# Patient Record
Sex: Male | Born: 1955 | Race: White | Hispanic: No | Marital: Single | Smoking: Former smoker
Health system: Southern US, Community
[De-identification: ages and names within clinical notes are randomized; demographics above are authoritative.]

## PROBLEM LIST (undated history)

## (undated) DIAGNOSIS — R011 Cardiac murmur, unspecified: Secondary | ICD-10-CM

## (undated) DIAGNOSIS — E785 Hyperlipidemia, unspecified: Secondary | ICD-10-CM

## (undated) DIAGNOSIS — M159 Polyosteoarthritis, unspecified: Secondary | ICD-10-CM

## (undated) DIAGNOSIS — K859 Acute pancreatitis without necrosis or infection, unspecified: Secondary | ICD-10-CM

## (undated) DIAGNOSIS — IMO0001 Reserved for inherently not codable concepts without codable children: Secondary | ICD-10-CM

## (undated) DIAGNOSIS — K5792 Diverticulitis of intestine, part unspecified, without perforation or abscess without bleeding: Secondary | ICD-10-CM

## (undated) DIAGNOSIS — A4902 Methicillin resistant Staphylococcus aureus infection, unspecified site: Secondary | ICD-10-CM

## (undated) DIAGNOSIS — Z5189 Encounter for other specified aftercare: Secondary | ICD-10-CM

## (undated) DIAGNOSIS — M150 Primary generalized (osteo)arthritis: Secondary | ICD-10-CM

## (undated) DIAGNOSIS — M1990 Unspecified osteoarthritis, unspecified site: Secondary | ICD-10-CM

## (undated) DIAGNOSIS — I10 Essential (primary) hypertension: Secondary | ICD-10-CM

## (undated) DIAGNOSIS — N400 Enlarged prostate without lower urinary tract symptoms: Secondary | ICD-10-CM

## (undated) DIAGNOSIS — B1920 Unspecified viral hepatitis C without hepatic coma: Secondary | ICD-10-CM

## (undated) HISTORY — PX: TONSILLECTOMY: SUR1361

## (undated) HISTORY — PX: APPENDECTOMY: SHX54

## (undated) HISTORY — PX: DENTAL SURGERY: SHX609

---

## 2004-08-30 ENCOUNTER — Emergency Department: Payer: Self-pay | Admitting: Emergency Medicine

## 2004-10-04 ENCOUNTER — Emergency Department: Payer: Self-pay | Admitting: Emergency Medicine

## 2004-11-18 ENCOUNTER — Emergency Department: Payer: Self-pay | Admitting: Unknown Physician Specialty

## 2006-05-27 ENCOUNTER — Emergency Department: Payer: Self-pay

## 2006-07-30 ENCOUNTER — Emergency Department: Payer: Self-pay | Admitting: Emergency Medicine

## 2006-11-21 ENCOUNTER — Emergency Department: Payer: Self-pay | Admitting: Emergency Medicine

## 2006-12-08 ENCOUNTER — Emergency Department: Payer: Self-pay | Admitting: Emergency Medicine

## 2007-07-09 ENCOUNTER — Emergency Department (HOSPITAL_COMMUNITY): Admission: EM | Admit: 2007-07-09 | Discharge: 2007-07-09 | Payer: Self-pay | Admitting: Emergency Medicine

## 2007-07-29 ENCOUNTER — Emergency Department (HOSPITAL_COMMUNITY): Admission: EM | Admit: 2007-07-29 | Discharge: 2007-07-29 | Payer: Self-pay | Admitting: Emergency Medicine

## 2007-08-16 ENCOUNTER — Emergency Department (HOSPITAL_COMMUNITY): Admission: EM | Admit: 2007-08-16 | Discharge: 2007-08-16 | Payer: Self-pay | Admitting: Emergency Medicine

## 2008-07-23 ENCOUNTER — Emergency Department (HOSPITAL_COMMUNITY): Admission: EM | Admit: 2008-07-23 | Discharge: 2008-07-23 | Payer: Self-pay | Admitting: Emergency Medicine

## 2008-08-12 ENCOUNTER — Emergency Department (HOSPITAL_COMMUNITY): Admission: EM | Admit: 2008-08-12 | Discharge: 2008-08-12 | Payer: Self-pay | Admitting: Emergency Medicine

## 2008-12-27 ENCOUNTER — Emergency Department (HOSPITAL_COMMUNITY): Admission: EM | Admit: 2008-12-27 | Discharge: 2008-12-28 | Payer: Self-pay | Admitting: Emergency Medicine

## 2009-05-05 ENCOUNTER — Emergency Department (HOSPITAL_COMMUNITY): Admission: EM | Admit: 2009-05-05 | Discharge: 2009-05-05 | Payer: Self-pay | Admitting: Emergency Medicine

## 2009-08-03 ENCOUNTER — Emergency Department (HOSPITAL_COMMUNITY): Admission: EM | Admit: 2009-08-03 | Discharge: 2009-08-03 | Payer: Self-pay | Admitting: Emergency Medicine

## 2010-05-04 ENCOUNTER — Emergency Department (HOSPITAL_COMMUNITY): Admission: EM | Admit: 2010-05-04 | Discharge: 2010-05-04 | Payer: Self-pay | Admitting: Emergency Medicine

## 2010-07-09 ENCOUNTER — Emergency Department (HOSPITAL_COMMUNITY): Admission: EM | Admit: 2010-07-09 | Discharge: 2010-07-09 | Payer: Self-pay | Admitting: Emergency Medicine

## 2010-10-07 ENCOUNTER — Emergency Department (HOSPITAL_COMMUNITY)
Admission: EM | Admit: 2010-10-07 | Discharge: 2010-10-07 | Disposition: A | Discharge status: Home / Self Care | Payer: Self-pay | Attending: *Deleted | Admitting: *Deleted

## 2010-10-07 DIAGNOSIS — R109 Unspecified abdominal pain: Secondary | ICD-10-CM | POA: Insufficient documentation

## 2010-10-07 DIAGNOSIS — E119 Type 2 diabetes mellitus without complications: Secondary | ICD-10-CM | POA: Insufficient documentation

## 2010-10-07 DIAGNOSIS — R112 Nausea with vomiting, unspecified: Secondary | ICD-10-CM | POA: Insufficient documentation

## 2010-10-08 ENCOUNTER — Emergency Department (HOSPITAL_COMMUNITY): Payer: Self-pay

## 2010-10-08 MED ORDER — IOHEXOL 300 MG/ML  SOLN
100.0000 mL | Freq: Once | INTRAMUSCULAR | Status: DC | PRN
  Administered 2010-10-08: 100 mL via INTRAVENOUS

## 2010-11-21 LAB — GLUCOSE, CAPILLARY: Glucose-Capillary: 122 mg/dL — ABNORMAL HIGH (ref 70–99)

## 2010-11-26 LAB — COMPREHENSIVE METABOLIC PANEL WITH GFR
ALT: 139 U/L — ABNORMAL HIGH (ref 0–53)
AST: 76 U/L — ABNORMAL HIGH (ref 0–37)
BUN: 4 mg/dL — ABNORMAL LOW (ref 6–23)
CO2: 29 meq/L (ref 19–32)
Chloride: 99 meq/L (ref 96–112)
GFR calc non Af Amer: >60 mL/min (ref >60)
Glucose, Bld: 184 mg/dL — ABNORMAL HIGH (ref 70–99)
Potassium: 4.4 meq/L (ref 3.5–5.1)
Total Bilirubin: 0.7 mg/dL (ref 0.3–1.2)

## 2010-11-26 LAB — LIPASE, BLOOD: Lipase: 26 U/L (ref 11–59)

## 2010-11-26 LAB — DIFFERENTIAL
Basophils Absolute: 0 K/uL (ref 0.0–0.1)
Basophils Relative: 0 % (ref 0–1)
Eosinophils Absolute: 0.2 K/uL (ref 0.0–0.7)
Eosinophils Relative: 3 % (ref 0–5)
Lymphocytes Relative: 33 % (ref 12–46)
Lymphs Abs: 1.8 K/uL (ref 0.7–4.0)
Monocytes Absolute: 0.5 10*3/uL (ref 0.1–1.0)
Monocytes Relative: 9 % (ref 3–12)
Neutro Abs: 3.1 10*3/uL (ref 1.7–7.7)
Neutrophils Relative %: 55 % (ref 43–77)

## 2010-11-26 LAB — COMPREHENSIVE METABOLIC PANEL
Albumin: 3.7 g/dL (ref 3.5–5.2)
Alkaline Phosphatase: 47 U/L (ref 39–117)
Calcium: 9.3 mg/dL (ref 8.4–10.5)
Creatinine, Ser: 0.8 mg/dL (ref 0.4–1.5)
GFR calc Af Amer: >60 mL/min (ref >60)
Sodium: 137 mEq/L (ref 135–145)
Total Protein: 7.2 g/dL (ref 6.0–8.3)

## 2010-11-26 LAB — CBC
HCT: 46.3 % (ref 39.0–52.0)
Hemoglobin: 15.4 g/dL (ref 13.0–17.0)
MCH: 29.7 pg (ref 26.0–34.0)
MCHC: 33.3 g/dL (ref 30.0–36.0)
MCV: 89.2 fL (ref 78.0–100.0)
Platelets: 174 10*3/uL (ref 150–400)
RBC: 5.19 MIL/uL (ref 4.22–5.81)
RDW: 12.4 % (ref 11.5–15.5)
WBC: 5.6 K/uL (ref 4.0–10.5)

## 2010-11-26 LAB — URINALYSIS, ROUTINE W REFLEX MICROSCOPIC
Bilirubin Urine: NEGATIVE
Glucose, UA: 100 mg/dL — AB
Hgb urine dipstick: NEGATIVE
Ketones, ur: NEGATIVE mg/dL
Nitrite: NEGATIVE
Protein, ur: NEGATIVE mg/dL
Specific Gravity, Urine: 1.005 (ref 1.005–1.030)
Urobilinogen, UA: 0.2 mg/dL (ref 0.0–1.0)
pH: 7 (ref 5.0–8.0)

## 2010-11-26 LAB — GLUCOSE, CAPILLARY

## 2010-12-10 LAB — DIFFERENTIAL
Basophils Absolute: 0.1 10*3/uL (ref 0.0–0.1)
Basophils Relative: 1 % (ref 0–1)
Eosinophils Absolute: 0.2 10*3/uL (ref 0.0–0.7)
Lymphocytes Relative: 36 % (ref 12–46)
Neutrophils Relative %: 49 % (ref 43–77)

## 2010-12-10 LAB — URINALYSIS, ROUTINE W REFLEX MICROSCOPIC
Bilirubin Urine: NEGATIVE
Glucose, UA: >1000 mg/dL — AB
Hgb urine dipstick: NEGATIVE
Leukocytes, UA: NEGATIVE
Protein, ur: NEGATIVE mg/dL
Specific Gravity, Urine: 1.039 — ABNORMAL HIGH (ref 1.005–1.030)
Urobilinogen, UA: 0.2 mg/dL (ref 0.0–1.0)
pH: 5 (ref 5.0–8.0)

## 2010-12-10 LAB — GLUCOSE, CAPILLARY: Glucose-Capillary: 233 mg/dL — ABNORMAL HIGH (ref 70–99)

## 2010-12-10 LAB — COMPREHENSIVE METABOLIC PANEL
AST: 80 U/L — ABNORMAL HIGH (ref 0–37)
Albumin: 4.4 g/dL (ref 3.5–5.2)
Alkaline Phosphatase: 52 U/L (ref 39–117)
CO2: 24 mEq/L (ref 19–32)
Calcium: 9.3 mg/dL (ref 8.4–10.5)
GFR calc Af Amer: >60 mL/min (ref >60)
GFR calc non Af Amer: >60 mL/min (ref >60)
Potassium: 3.2 mEq/L — ABNORMAL LOW (ref 3.5–5.1)
Total Bilirubin: 1.1 mg/dL (ref 0.3–1.2)
Total Protein: 8 g/dL (ref 6.0–8.3)

## 2010-12-13 LAB — URINALYSIS, ROUTINE W REFLEX MICROSCOPIC
Glucose, UA: 250 mg/dL — AB
Ketones, ur: NEGATIVE mg/dL
Nitrite: NEGATIVE
Protein, ur: NEGATIVE mg/dL
pH: 5 (ref 5.0–8.0)

## 2010-12-13 LAB — BASIC METABOLIC PANEL
CO2: 26 mEq/L (ref 19–32)
Calcium: 9.3 mg/dL (ref 8.4–10.5)
Sodium: 135 mEq/L (ref 135–145)

## 2010-12-13 LAB — CBC
MCHC: 35 g/dL (ref 30.0–36.0)
MCV: 90 fL (ref 78.0–100.0)
Platelets: 202 10*3/uL (ref 150–400)
WBC: 6.4 10*3/uL (ref 4.0–10.5)

## 2010-12-13 LAB — DIFFERENTIAL
Basophils Absolute: 0.1 10*3/uL (ref 0.0–0.1)
Basophils Relative: 1 % (ref 0–1)
Eosinophils Absolute: 0.2 10*3/uL (ref 0.0–0.7)
Neutrophils Relative %: 47 % (ref 43–77)

## 2010-12-17 LAB — URINALYSIS, ROUTINE W REFLEX MICROSCOPIC
Leukocytes, UA: NEGATIVE
Protein, ur: NEGATIVE mg/dL
Urobilinogen, UA: 1 mg/dL (ref 0.0–1.0)

## 2010-12-17 LAB — DIFFERENTIAL
Basophils Relative: 1 % (ref 0–1)
Lymphs Abs: 1.7 10*3/uL (ref 0.7–4.0)
Monocytes Absolute: 0.6 10*3/uL (ref 0.1–1.0)
Monocytes Relative: 10 % (ref 3–12)
Neutro Abs: 4 10*3/uL (ref 1.7–7.7)

## 2010-12-17 LAB — COMPREHENSIVE METABOLIC PANEL
Albumin: 3.5 g/dL (ref 3.5–5.2)
Alkaline Phosphatase: 57 U/L (ref 39–117)
BUN: 7 mg/dL (ref 6–23)
Potassium: 3.7 mEq/L (ref 3.5–5.1)
Sodium: 133 mEq/L — ABNORMAL LOW (ref 135–145)
Total Protein: 6.7 g/dL (ref 6.0–8.3)

## 2010-12-17 LAB — CBC
HCT: 44.5 % (ref 39.0–52.0)
Platelets: 158 10*3/uL (ref 150–400)
RDW: 12.4 % (ref 11.5–15.5)

## 2010-12-17 LAB — URINE MICROSCOPIC-ADD ON

## 2011-01-04 ENCOUNTER — Emergency Department: Payer: Self-pay | Admitting: Emergency Medicine

## 2011-08-30 ENCOUNTER — Emergency Department (HOSPITAL_COMMUNITY): Payer: Self-pay

## 2011-08-30 ENCOUNTER — Emergency Department (HOSPITAL_COMMUNITY)
Admission: EM | Admit: 2011-08-30 | Discharge: 2011-08-30 | Disposition: A | Discharge status: Home / Self Care | Payer: Self-pay | Attending: Emergency Medicine | Admitting: Emergency Medicine

## 2011-08-30 ENCOUNTER — Encounter: Payer: Self-pay | Admitting: Emergency Medicine

## 2011-08-30 DIAGNOSIS — S139XXA Sprain of joints and ligaments of unspecified parts of neck, initial encounter: Secondary | ICD-10-CM | POA: Insufficient documentation

## 2011-08-30 DIAGNOSIS — S161XXA Strain of muscle, fascia and tendon at neck level, initial encounter: Secondary | ICD-10-CM

## 2011-08-30 DIAGNOSIS — W010XXA Fall on same level from slipping, tripping and stumbling without subsequent striking against object, initial encounter: Secondary | ICD-10-CM | POA: Insufficient documentation

## 2011-08-30 DIAGNOSIS — M549 Dorsalgia, unspecified: Secondary | ICD-10-CM | POA: Insufficient documentation

## 2011-08-30 HISTORY — DX: Unspecified osteoarthritis, unspecified site: M19.90

## 2011-08-30 MED ORDER — OXYCODONE-ACETAMINOPHEN 5-325 MG PO TABS
2.0000 | ORAL_TABLET | Freq: Once | ORAL | Status: AC
  Administered 2011-08-30: 2 via ORAL
  Filled 2011-08-30: qty 2

## 2011-08-30 MED ORDER — HYDROCODONE-ACETAMINOPHEN 5-500 MG PO TABS: 1.0000 | ORAL_TABLET | Freq: Four times a day (QID) | ORAL | Status: AC | PRN

## 2011-08-30 MED ORDER — CYCLOBENZAPRINE HCL 10 MG PO TABS: 5.0000 mg | ORAL_TABLET | Freq: Two times a day (BID) | ORAL | Status: AC | PRN

## 2011-08-30 NOTE — ED Notes (Signed)
Pt states he slipped in yogurt in grocery store approx 1 hour ago.  C/o pain across shoulder blades and neck.  Denies LOC.

## 2011-08-30 NOTE — ED Notes (Signed)
Pt slipped in yogurt and fell in grocery store approx 1 hour pta.  C/o pain to neck and across shoulder blades.  Denies LOC.  Ambulatory.C-collar applied on arrival at triage.

## 2011-08-30 NOTE — ED Provider Notes (Signed)
History     CSN: [id number]  Arrival date & time 08/30/11  4098   First MD Initiated Contact with Patient 08/30/11 1914      Chief Complaint  Patient presents with  . Fall    (Consider location/radiation/quality/duration/timing/severity/associated sxs/prior treatment) HPI Comments: Patient was walking in the grocery store slipped on an open container of yogurt that was accidentally dropped on the floor landing on his right shoulder, upper back area, now complaining of pain across the shoulders and lower neck, increases with turning his head, right or left has not taking any medication.  Prior to arrival as this happened less than 45 minutes ago  Patient is a 55 y.o. male presenting with fall. The history is provided by the patient.  Fall The fall occurred while walking. He fell from a height of 3 to 5 ft. He landed on a hard floor. The point of impact was the right shoulder. Pertinent negatives include no numbness and no headaches.    Past Medical History  Diagnosis Date  . Arthritis   . Diabetes mellitus     History reviewed. No pertinent past surgical history.  History reviewed. No pertinent family history.  History  Substance Use Topics  . Smoking status: Never Smoker   . Smokeless tobacco: Not on file  . Alcohol Use: No      Review of Systems  Constitutional: Negative.   HENT: Negative.   Eyes: Negative.   Respiratory: Negative.   Cardiovascular: Negative.   Gastrointestinal: Negative.   Genitourinary: Negative.   Musculoskeletal: Positive for back pain.  Skin: Negative.   Neurological: Negative for dizziness, numbness and headaches.  Hematological: Negative.   Psychiatric/Behavioral: Negative.     Allergies  Review of patient's allergies indicates no known allergies.  Home Medications   Current Outpatient Rx  Name Route Sig Dispense Refill  . METFORMIN HCL 500 MG PO TABS Oral Take 500 mg by mouth 2 (two) times daily with a meal.        BP  156/107  Pulse 93  Resp 16  SpO2 100%  Physical Exam  Constitutional: He appears well-developed and well-nourished.  HENT:  Head: Normocephalic.  Eyes: Pupils are equal, round, and reactive to light.  Neck: Spinous process tenderness and muscular tenderness present. No rigidity. Decreased range of motion present. No edema and no erythema present.      ED Course  Procedures (including critical care time)  Labs Reviewed - No data to display Dg Cervical Spine Complete  08/30/2011  *RADIOLOGY REPORT*  Clinical Data:  Pain, fall, struck shoulder, posterior neck pain  CERVICAL SPINE - COMPLETE 4+ VIEW  Comparison: None  Findings: Examination performed upright in-collar. The presence of a collar on upright images of the cervical spine may prevent identification of ligamentous and unstable injuries.  Osseous demineralization. Disc space narrowing with endplate spur formation C4-C5, C5-C6, C6- C7. Prevertebral soft tissues normal thickness. Scattered facet degenerative changes. Vertebral body heights maintained without fracture or subluxation. Multilevel neural foraminal narrowing, greatest at C5-C6 bilaterally. C1-C2 alignment normal.  IMPRESSION: Degenerative disc and facet disease changes of the cervical spine. No acute abnormalities identified on upright in-collar cervical spine series as above.  Original Report Authenticated By: Lollie Marrow, M.D.     No diagnosis found.    MDM  Will x-ray to rule out subtle fracture although I doubt this after exam.  This is most likely muscular injury.  Will supply pain medication while in the emergency room.  Arman Filter, NP 08/30/11 2008  Arman Filter, NP 08/30/11 2126

## 2011-08-30 NOTE — ED Provider Notes (Signed)
Medical screening examination/treatment/procedure(s) were performed by non-physician practitioner and as supervising physician I was immediately available for consultation/collaboration.   Lyanne Co, MD 08/30/11 [phone]

## 2011-09-04 ENCOUNTER — Emergency Department (HOSPITAL_COMMUNITY)
Admission: EM | Admit: 2011-09-04 | Discharge: 2011-09-04 | Disposition: A | Discharge status: Home / Self Care | Payer: Self-pay | Attending: Emergency Medicine | Admitting: Emergency Medicine

## 2011-09-04 ENCOUNTER — Encounter (HOSPITAL_COMMUNITY): Payer: Self-pay | Admitting: *Deleted

## 2011-09-04 DIAGNOSIS — Z79899 Other long term (current) drug therapy: Secondary | ICD-10-CM | POA: Insufficient documentation

## 2011-09-04 DIAGNOSIS — S139XXA Sprain of joints and ligaments of unspecified parts of neck, initial encounter: Secondary | ICD-10-CM | POA: Insufficient documentation

## 2011-09-04 DIAGNOSIS — S161XXA Strain of muscle, fascia and tendon at neck level, initial encounter: Secondary | ICD-10-CM

## 2011-09-04 DIAGNOSIS — M542 Cervicalgia: Secondary | ICD-10-CM | POA: Insufficient documentation

## 2011-09-04 DIAGNOSIS — M549 Dorsalgia, unspecified: Secondary | ICD-10-CM | POA: Insufficient documentation

## 2011-09-04 DIAGNOSIS — M5412 Radiculopathy, cervical region: Secondary | ICD-10-CM | POA: Insufficient documentation

## 2011-09-04 DIAGNOSIS — W19XXXA Unspecified fall, initial encounter: Secondary | ICD-10-CM | POA: Insufficient documentation

## 2011-09-04 DIAGNOSIS — E119 Type 2 diabetes mellitus without complications: Secondary | ICD-10-CM | POA: Insufficient documentation

## 2011-09-04 DIAGNOSIS — M129 Arthropathy, unspecified: Secondary | ICD-10-CM | POA: Insufficient documentation

## 2011-09-04 MED ORDER — PREDNISONE 20 MG PO TABS
ORAL_TABLET | ORAL | Status: AC
  Filled 2011-09-04: qty 1

## 2011-09-04 MED ORDER — PREDNISONE 20 MG PO TABS
60.0000 mg | ORAL_TABLET | Freq: Once | ORAL | Status: AC
  Administered 2011-09-04: 60 mg via ORAL
  Filled 2011-09-04: qty 1

## 2011-09-04 MED ORDER — DIAZEPAM 5 MG PO TABS: 5.0000 mg | ORAL_TABLET | Freq: Three times a day (TID) | ORAL | Status: AC | PRN

## 2011-09-04 MED ORDER — ONDANSETRON 4 MG PO TBDP
8.0000 mg | ORAL_TABLET | Freq: Once | ORAL | Status: AC
  Administered 2011-09-04: 8 mg via ORAL
  Filled 2011-09-04: qty 1

## 2011-09-04 MED ORDER — HYDROMORPHONE HCL PF 2 MG/ML IJ SOLN
2.0000 mg | Freq: Once | INTRAMUSCULAR | Status: AC
  Administered 2011-09-04: 2 mg via INTRAMUSCULAR
  Filled 2011-09-04: qty 1

## 2011-09-04 MED ORDER — PREDNISONE 20 MG PO TABS: 40.0000 mg | ORAL_TABLET | Freq: Every day | ORAL | Status: AC

## 2011-09-04 MED ORDER — DIAZEPAM 5 MG PO TABS
5.0000 mg | ORAL_TABLET | Freq: Once | ORAL | Status: AC
  Administered 2011-09-04: 5 mg via ORAL
  Filled 2011-09-04: qty 1

## 2011-09-04 NOTE — ED Provider Notes (Signed)
History     CSN: [id number]  Arrival date & time 09/04/11  1656   First MD Initiated Contact with Patient 09/04/11 2254      Chief Complaint  Patient presents with  . Fall     Patient is a 55 y.o. male presenting with neck injury. The history is provided by the patient.  Neck Injury This is a new problem. The current episode started in the past 7 days. The problem occurs constantly. The problem has been gradually worsening. The symptoms are aggravated by twisting. He has tried NSAIDs and oral narcotics for the symptoms.  Neck Injury This is a new problem. The current episode started in the past 7 days. The problem occurs constantly. The problem has been gradually worsening. The symptoms are aggravated by twisting. He has tried NSAIDs and oral narcotics for the symptoms.  Patient reports worsening neck pain since fall 08/30/2011. States was seen here on the day of the fall and told no broken bones. Now pain radiates from the mid neck into bilateral upper back. It pain is constant ache with occasional sharp type pains in bilateral upper back. Currently reports pain as a 9/10. States ibuprofen not helping.  Past Medical History  Diagnosis Date  . Arthritis   . Diabetes mellitus     History reviewed. No pertinent past surgical history.  History reviewed. No pertinent family history.  History  Substance Use Topics  . Smoking status: Never Smoker   . Smokeless tobacco: Not on file  . Alcohol Use: No      Review of Systems  Constitutional: Negative.   HENT: Negative.   Eyes: Negative.   Respiratory: Negative.   Cardiovascular: Negative.   Gastrointestinal: Negative.   Genitourinary: Negative.   Musculoskeletal: Negative.   Skin: Negative.   Neurological: Negative.   Hematological: Negative.   Psychiatric/Behavioral: Negative.     Allergies  Review of patient's allergies indicates no known allergies.  Home Medications   Current Outpatient Rx  Name Route Sig  Dispense Refill  . CYCLOBENZAPRINE HCL 10 MG PO TABS Oral Take 0.5 tablets (5 mg total) by mouth 2 (two) times daily as needed for muscle spasms. 20 tablet 0  . HYDROCODONE-ACETAMINOPHEN 5-500 MG PO TABS Oral Take 1-2 tablets by mouth every 6 (six) hours as needed for pain. 15 tablet 0  . METFORMIN HCL 500 MG PO TABS Oral Take 500 mg by mouth 2 (two) times daily with a meal.        BP 149/94  Pulse 99  Temp(Src) 97.5 F (36.4 C) (Oral)  Resp 16  SpO2 97%  Physical Exam  Constitutional: He appears well-developed and well-nourished.  HENT:  Head: Normocephalic and atraumatic.  Eyes: Conjunctivae are normal.  Neck: Neck supple.  Cardiovascular: Normal rate and regular rhythm.   Pulmonary/Chest: Effort normal and breath sounds normal.  Abdominal: Soft. Bowel sounds are normal.  Musculoskeletal: Normal range of motion.       Mild TTP over lower C-spine patient reports pain radiates into bilateral upper back  Neurological: He is alert.  Skin: Skin is warm and dry.  Psychiatric: He has a normal mood and affect.    ED Course  Procedures Discussed with patient that  X-rays of his neck on 08/30/2011  are without acute findings. If pain continues and does not improve it will be necessary for him to arrange followup with an orthopedic physician. Will treat patient's pain here today and plan for discharge home on prednisone with muscle relaxers  to add to his ibuprofen. She is agreeable with plan to   1. Cervical strain   2. Cervical radiculopathy      Cervical strain    Medical screening examination/treatment/procedure(s) were performed by non-physician practitioner and as supervising physician I was immediately available for consultation/collaboration. Osvaldo Human, M.D.     Leanne Chang, NP 09/04/11 2331  Roma Kayser Schorr, NP 09/04/11 4098  Carleene Cooper III, MD 09/05/11 1140

## 2011-09-04 NOTE — ED Notes (Addendum)
Complaining of headache. States gets minor relief with ibuprofen. Located frontal and described as throbbing. Also c/o nausea

## 2011-09-04 NOTE — ED Notes (Signed)
The pt fell 7 days ago and he struck his head .  He was seen here in the ed shortly afterward and was given meds.  His neck stiffness and head pain are persisting.

## 2011-11-11 ENCOUNTER — Emergency Department (HOSPITAL_COMMUNITY)
Admission: EM | Admit: 2011-11-11 | Discharge: 2011-11-11 | Disposition: A | Discharge status: Home / Self Care | Payer: Self-pay | Attending: Emergency Medicine | Admitting: Emergency Medicine

## 2011-11-11 ENCOUNTER — Encounter (HOSPITAL_COMMUNITY): Payer: Self-pay | Admitting: Emergency Medicine

## 2011-11-11 DIAGNOSIS — L600 Ingrowing nail: Secondary | ICD-10-CM | POA: Insufficient documentation

## 2011-11-11 DIAGNOSIS — M129 Arthropathy, unspecified: Secondary | ICD-10-CM | POA: Insufficient documentation

## 2011-11-11 DIAGNOSIS — E119 Type 2 diabetes mellitus without complications: Secondary | ICD-10-CM | POA: Insufficient documentation

## 2011-11-11 DIAGNOSIS — Z8619 Personal history of other infectious and parasitic diseases: Secondary | ICD-10-CM | POA: Insufficient documentation

## 2011-11-11 DIAGNOSIS — B1920 Unspecified viral hepatitis C without hepatic coma: Secondary | ICD-10-CM | POA: Insufficient documentation

## 2011-11-11 DIAGNOSIS — A4902 Methicillin resistant Staphylococcus aureus infection, unspecified site: Secondary | ICD-10-CM | POA: Insufficient documentation

## 2011-11-11 DIAGNOSIS — Z8614 Personal history of Methicillin resistant Staphylococcus aureus infection: Secondary | ICD-10-CM | POA: Insufficient documentation

## 2011-11-11 HISTORY — DX: Unspecified viral hepatitis C without hepatic coma: B19.20

## 2011-11-11 HISTORY — DX: Methicillin resistant Staphylococcus aureus infection, unspecified site: A49.02

## 2011-11-11 HISTORY — DX: Encounter for other specified aftercare: Z51.89

## 2011-11-11 HISTORY — DX: Reserved for inherently not codable concepts without codable children: IMO0001

## 2011-11-11 MED ORDER — OXYCODONE-ACETAMINOPHEN 5-325 MG PO TABS
2.0000 | ORAL_TABLET | Freq: Once | ORAL | Status: AC
  Administered 2011-11-11: 2 via ORAL
  Filled 2011-11-11: qty 2

## 2011-11-11 MED ORDER — CLINDAMYCIN HCL 150 MG PO CAPS: 300.0000 mg | ORAL_CAPSULE | Freq: Three times a day (TID) | ORAL | Status: DC

## 2011-11-11 MED ORDER — OXYCODONE-ACETAMINOPHEN 5-325 MG PO TABS: 1.0000 | ORAL_TABLET | ORAL | Status: DC | PRN

## 2011-11-11 MED ORDER — CLINDAMYCIN HCL 300 MG PO CAPS
300.0000 mg | ORAL_CAPSULE | Freq: Once | ORAL | Status: AC
  Administered 2011-11-11: 300 mg via ORAL
  Filled 2011-11-11: qty 1

## 2011-11-11 NOTE — Discharge Instructions (Signed)
Infected Ingrown Toenail  An infected ingrown toenail occurs when the nail edge grows into the skin and bacteria invade the area. Symptoms include pain, tenderness, swelling, and pus drainage from the edge of the nail. Poorly fitting shoes, minor injuries, and improper cutting of the toenail may also contribute to the problem. You should cut your toenails squarely instead of rounding the edges. Do not cut them too short. Avoid tight or pointed toe shoes. Sometimes the ingrown portion of the nail must be removed. If your toenail is removed, it can take 3-4 months for it to re-grow.  HOME CARE INSTRUCTIONS     Soak your infected toe in warm water for 20-30 minutes, 2 to 3 times a day.    Packing or dressings applied to the area should be changed daily.    Take medicine as directed and finish them.    Reduce activities and keep your foot elevated when able to reduce swelling and discomfort. Do this until the infection gets better.    Wear sandals or go barefoot as much as possible while the infected area is sensitive.    See your caregiver for follow-up care in 2-3 days if the infection is not better.   SEEK MEDICAL CARE IF:    Your toe is becoming more red, swollen or painful.  MAKE SURE YOU:     Understand these instructions.    Will watch your condition.    Will get help right away if you are not doing well or get worse.   Document Released: 10/01/2004 Document Revised: 08/13/2011 Document Reviewed: 08/20/2008  [employer] Patient Information 2012 [employer]

## 2011-11-11 NOTE — ED Notes (Signed)
D/C inst given by MD. Pt verbalized understanding. Rx given to pt.

## 2011-11-11 NOTE — ED Notes (Signed)
Patient complaining of a left toe infection; complaining of pain for the last week.  Patient states that he was diagnosed with MRSA a year ago.  Patient has been treating toe with peroxide; patient reports pus-like drainage from toe.

## 2011-11-11 NOTE — ED Provider Notes (Signed)
History     CSN: [id number]  Arrival date & time 11/11/11  1821   First MD Initiated Contact with Patient 11/11/11 1846      Chief Complaint  Patient presents with  . Toe Pain     Patient is a 56 y.o. male presenting with toe pain. The history is provided by the patient.  Toe Pain This is a new problem. The current episode started more than 2 days ago. The problem occurs constantly. The problem has been gradually worsening. Pertinent negatives include no headaches. The symptoms are aggravated by walking. The symptoms are relieved by rest. He has tried water for the symptoms. The treatment provided no relief.  pt reports pain/swelling/drainage from left great toe No trauma Reports he thinks it is MRSA It has been draining pus No fever/vomiting/chills He reports he is monitoring his glucose and he keeps it below 200  Past Medical History  Diagnosis Date  . Arthritis   . Diabetes mellitus   . MRSA (methicillin resistant Staphylococcus aureus)   . Hepatitis C   . Blood transfusion     History reviewed. No pertinent past surgical history.  History reviewed. No pertinent family history.  History  Substance Use Topics  . Smoking status: Never Smoker   . Smokeless tobacco: Not on file  . Alcohol Use: No      Review of Systems  Constitutional: Negative for fever.  Neurological: Negative for headaches.    Allergies  Review of patient's allergies indicates no known allergies.  Home Medications   Current Outpatient Rx  Name Route Sig Dispense Refill  . GLIPIZIDE PO Oral Take 2 tablets by mouth 2 (two) times daily.    Marland Kitchen METFORMIN HCL 500 MG PO TABS Oral Take 500 mg by mouth 2 (two) times daily with a meal.      . CLINDAMYCIN HCL 150 MG PO CAPS Oral Take 2 capsules (300 mg total) by mouth 3 (three) times daily. 42 capsule 0  . OXYCODONE-ACETAMINOPHEN 5-325 MG PO TABS Oral Take 1 tablet by mouth every 4 (four) hours as needed for pain. 15 tablet 0    BP 146/101  Pulse  100  Temp(Src) 98.3 F (36.8 C) (Oral)  Resp 16  SpO2 100%  Physical Exam CONSTITUTIONAL: Well developed/well nourished HEAD AND FACE: Normocephalic/atraumatic EYES: EOMI/PERRL ENMT: Mucous membranes moist NECK: supple no meningeal signs CV: S1/S2 noted, no murmurs/rubs/gallops noted LUNGS: Lungs are clear to auscultation bilaterally, no apparent distress ABDOMEN: soft, nontender, no rebound or guarding NEURO: Pt is awake/alert, moves all extremitiesx4 EXTREMITIES: pulses normal, full ROM.  Left great toe.  Area of erythema surrounding nail, with small amt of drainage on medial aspect.   No crepitance. No streaking proximally.  He can range the toe.  No other tenderness noted to foot.   SKIN: warm, color normal PSYCH: no abnormalities of mood noted  ED Course  Procedures     1. Ingrown toenail       MDM  Nursing notes reviewed and considered in documentation   Pt well appearing, no distress Likely ingrown toenail, but it appears isolated/superficial and I doubt osteomyelitis or deep space infection After discussion, patient and I agreed for him to start clindamycin, continue warm soaks, will defer nail removal at this time.  He will return here in 48 hrs if there is no improvement.  Discussed need for f/u with podiatrist  The patient appears reasonably screened and/or stabilized for discharge and I doubt any other medical condition or  other EMC requiring further screening, evaluation, or treatment in the ED at this time prior to discharge.         Joya Gaskins, MD 11/11/11 2002

## 2011-11-21 ENCOUNTER — Encounter (HOSPITAL_COMMUNITY): Payer: Self-pay

## 2011-11-21 ENCOUNTER — Emergency Department (HOSPITAL_COMMUNITY)
Admission: EM | Admit: 2011-11-21 | Discharge: 2011-11-21 | Disposition: A | Discharge status: Home / Self Care | Payer: Self-pay | Attending: Emergency Medicine | Admitting: Emergency Medicine

## 2011-11-21 DIAGNOSIS — M129 Arthropathy, unspecified: Secondary | ICD-10-CM | POA: Insufficient documentation

## 2011-11-21 DIAGNOSIS — E119 Type 2 diabetes mellitus without complications: Secondary | ICD-10-CM | POA: Insufficient documentation

## 2011-11-21 DIAGNOSIS — B1920 Unspecified viral hepatitis C without hepatic coma: Secondary | ICD-10-CM | POA: Insufficient documentation

## 2011-11-21 DIAGNOSIS — Z8614 Personal history of Methicillin resistant Staphylococcus aureus infection: Secondary | ICD-10-CM | POA: Insufficient documentation

## 2011-11-21 DIAGNOSIS — L03032 Cellulitis of left toe: Secondary | ICD-10-CM

## 2011-11-21 DIAGNOSIS — L03039 Cellulitis of unspecified toe: Secondary | ICD-10-CM | POA: Insufficient documentation

## 2011-11-21 MED ORDER — OXYCODONE-ACETAMINOPHEN 5-325 MG PO TABS: 1.0000 | ORAL_TABLET | Freq: Four times a day (QID) | ORAL | Status: DC | PRN

## 2011-11-21 MED ORDER — CLINDAMYCIN HCL 150 MG PO CAPS: 150.0000 mg | ORAL_CAPSULE | Freq: Four times a day (QID) | ORAL | Status: DC

## 2011-11-21 NOTE — Discharge Instructions (Signed)
Paronychia  Paronychia is an inflammatory reaction involving the folds of the skin surrounding the fingernail. This is commonly caused by an infection in the skin around a nail. The most common cause of paronychia is frequent wetting of the hands (as seen with bartenders, food servers, nurses or others who wet their hands). This makes the skin around the fingernail susceptible to infection by bacteria (germs) or fungus. Other predisposing factors are:   Aggressive manicuring.   Nail biting.   Thumb sucking.  The most common cause is a staphylococcal (a type of germ) infection, or a fungal (Candida) infection. When caused by a germ, it usually comes on suddenly with redness, swelling, pus and is often painful. It may get under the nail and form an abscess (collection of pus), or form an abscess around the nail. If the nail itself is infected with a fungus, the treatment is usually prolonged and may require oral medicine for up to one year. Your caregiver will determine the length of time treatment is required. The paronychia caused by bacteria (germs) may largely be avoided by not pulling on hangnails or picking at cuticles. When the infection occurs at the tips of the finger it is called felon. When the cause of paronychia is from the herpes simplex virus (HSV) it is called herpetic whitlow.  TREATMENT   When an abscess is present treatment is often incision and drainage. This means that the abscess must be cut open so the pus can get out. When this is done, the following home care instructions should be followed.  HOME CARE INSTRUCTIONS    It is important to keep the affected fingers very dry. Rubber or plastic gloves over cotton gloves should be used whenever the hand must be placed in water.   Keep wound clean, dry and dressed as suggested by your caregiver between warm soaks or warm compresses.   Soak in warm water for fifteen to twenty minutes three to four times per day for bacterial infections. Fungal  infections are very difficult to treat, so often require treatment for long periods of time.   For bacterial (germ) infections take antibiotics (medicine which kill germs) as directed and finish the prescription, even if the problem appears to be solved before the medicine is gone.   Only take over-the-counter or prescription medicines for pain, discomfort, or fever as directed by your caregiver.  SEEK IMMEDIATE MEDICAL CARE IF:   You have redness, swelling, or increasing pain in the wound.   You notice pus coming from the wound.   You have a fever.   You notice a bad smell coming from the wound or dressing.  Document Released: 02/17/2001 Document Revised: 08/13/2011 Document Reviewed: 10/19/2008  [employer] Patient Information 2012 [employer].

## 2011-11-21 NOTE — ED Provider Notes (Signed)
History     CSN: [id number]  Arrival date & time 11/21/11  0907   First MD Initiated Contact with Patient 11/21/11 [phone]      Chief Complaint  Patient presents with  . Toe Pain    (Consider location/radiation/quality/duration/timing/severity/associated sxs/prior treatment) HPI  56 year old male with history of MRSA presents with infection of his left great toe. States symptoms started 3 weeks ago. Patient was seen in the ED for same complaint 10 days ago. Was diagnosed with ingrown toenail. Was discharge with clindamycin, with instructions to follow up with podiatrist. Patient states he has not had a chest follow up with podiatrist yet. He continues to complain of pain. Pain is described as throbbing sensation worsened with any types of pressure on his toe. Denies pain in joint. He denies radiating pain or red streak from infection.  He denies fever, or rash. He does notice surrounding skin slough off. States he has been soaking toes in warm water with salt, with minimal relief. States he has a history of diabetes but has been monitoring his glucose very carefully keeping it between 100-120. Patient requests for podiatrist followup.  Past Medical History  Diagnosis Date  . Arthritis   . Diabetes mellitus   . MRSA (methicillin resistant Staphylococcus aureus)   . Hepatitis C   . Blood transfusion     History reviewed. No pertinent past surgical history.  History reviewed. No pertinent family history.  History  Substance Use Topics  . Smoking status: Never Smoker   . Smokeless tobacco: Not on file  . Alcohol Use: No      Review of Systems  All other systems reviewed and are negative.    Allergies  Review of patient's allergies indicates no known allergies.  Home Medications   Current Outpatient Rx  Name Route Sig Dispense Refill  . GLIPIZIDE PO Oral Take 2 tablets by mouth 2 (two) times daily.    Marland Kitchen METFORMIN HCL 500 MG PO TABS Oral Take 500 mg by mouth 2 (two) times  daily with a meal.      . OXYCODONE-ACETAMINOPHEN 5-325 MG PO TABS Oral Take 1 tablet by mouth every 4 (four) hours as needed. For pain    . CLINDAMYCIN HCL 150 MG PO CAPS Oral Take 300 mg by mouth 3 (three) times daily. Started 11/11/11 ending 11/21/11      BP 118/84  Pulse 83  Temp 97.6 F (36.4 C)  Resp 20  SpO2 98%  Physical Exam  Nursing note and vitals reviewed. Constitutional: He appears well-developed and well-nourished. No distress.  HENT:  Head: Atraumatic.  Eyes: Conjunctivae are normal.  Neck: Neck supple.  Neurological: He is alert.  Skin:     Psychiatric: He has a normal mood and affect.    ED Course  Procedures (including critical care time)  Labs Reviewed - No data to display No results found.   No diagnosis found.    MDM  Paronychia of left great toe. Doubt osteomyelitis. No signs of systemic infection. The patient would best benefit from a podiatrist followup. We'll prescribe clindamycin and pain medication. Patient voiced understanding.        Fayrene Helper, PA-C 11/21/11 [phone]

## 2011-11-21 NOTE — ED Notes (Signed)
Complains of left great toe infection, hx of mrsa, onset three weeks ago and need referral for podiatrist, and cant see  His md until April 9

## 2011-11-21 NOTE — ED Provider Notes (Signed)
Medical screening examination/treatment/procedure(s) were performed by non-physician practitioner and as supervising physician I was immediately available for consultation/collaboration.  Jericca Russett, MD 11/21/11 1350 

## 2011-11-25 ENCOUNTER — Emergency Department (HOSPITAL_COMMUNITY): Payer: Self-pay

## 2011-11-25 ENCOUNTER — Emergency Department (HOSPITAL_COMMUNITY)
Admission: EM | Admit: 2011-11-25 | Discharge: 2011-11-25 | Disposition: A | Discharge status: Home / Self Care | Payer: Self-pay | Attending: Emergency Medicine | Admitting: Emergency Medicine

## 2011-11-25 ENCOUNTER — Encounter (HOSPITAL_COMMUNITY): Payer: Self-pay | Admitting: *Deleted

## 2011-11-25 DIAGNOSIS — S96819A Strain of other specified muscles and tendons at ankle and foot level, unspecified foot, initial encounter: Secondary | ICD-10-CM | POA: Insufficient documentation

## 2011-11-25 DIAGNOSIS — S93409A Sprain of unspecified ligament of unspecified ankle, initial encounter: Secondary | ICD-10-CM

## 2011-11-25 DIAGNOSIS — M25579 Pain in unspecified ankle and joints of unspecified foot: Secondary | ICD-10-CM | POA: Insufficient documentation

## 2011-11-25 DIAGNOSIS — Z8614 Personal history of Methicillin resistant Staphylococcus aureus infection: Secondary | ICD-10-CM | POA: Insufficient documentation

## 2011-11-25 DIAGNOSIS — M129 Arthropathy, unspecified: Secondary | ICD-10-CM | POA: Insufficient documentation

## 2011-11-25 DIAGNOSIS — B1920 Unspecified viral hepatitis C without hepatic coma: Secondary | ICD-10-CM | POA: Insufficient documentation

## 2011-11-25 DIAGNOSIS — S93499A Sprain of other ligament of unspecified ankle, initial encounter: Secondary | ICD-10-CM | POA: Insufficient documentation

## 2011-11-25 DIAGNOSIS — E119 Type 2 diabetes mellitus without complications: Secondary | ICD-10-CM | POA: Insufficient documentation

## 2011-11-25 DIAGNOSIS — X500XXA Overexertion from strenuous movement or load, initial encounter: Secondary | ICD-10-CM | POA: Insufficient documentation

## 2011-11-25 MED ORDER — OXYCODONE-ACETAMINOPHEN 5-325 MG PO TABS
2.0000 | ORAL_TABLET | Freq: Once | ORAL | Status: AC
  Administered 2011-11-25: 2 via ORAL
  Filled 2011-11-25: qty 2

## 2011-11-25 MED ORDER — HYDROCODONE-ACETAMINOPHEN 5-325 MG PO TABS: 1.0000 | ORAL_TABLET | ORAL | Status: AC | PRN

## 2011-11-25 MED ORDER — OXYCODONE-ACETAMINOPHEN 5-325 MG PO TABS
1.0000 | ORAL_TABLET | Freq: Once | ORAL | Status: AC
  Administered 2011-11-25: 1 via ORAL
  Filled 2011-11-25: qty 1

## 2011-11-25 NOTE — Progress Notes (Signed)
Orthopedic Tech Progress Note Patient Details:  Frederick Morales [birth year] [id number]  Other Ortho Devices Type of Ortho Device: ASO Ortho Device Location: (L) LE Ortho Device Interventions: Application   Jennye Moccasin 11/25/2011, 9:39 PM

## 2011-11-25 NOTE — ED Notes (Addendum)
Pt states that pt was walking and states the curb was uneven and stepped on the drop off and reports severe pain to left ankle. Pt with noted infection to left greater toe. Pt with mild movement to left ankle, pt is not able to move a lot. Cap refill less than 3. Pt states he was able to walk on his ankle at first but not now.

## 2011-11-25 NOTE — ED Provider Notes (Signed)
History     CSN: [id number]  Arrival date & time 11/25/11  1724   First MD Initiated Contact with Patient 11/25/11 2055      Chief Complaint  Patient presents with  . Ankle Pain    (Consider location/radiation/quality/duration/timing/severity/associated sxs/prior treatment) HPI Comments: Patient reports that earlier today he stepped off of a curb and twisted his left ankle.  He has been having pain and swelling over the lateral malleolus of that ankle since that time.  He denies any numbness or tingling.   He reports that he has been unable to bear weight because of the pain.  Patient is a 56 y.o. male presenting with ankle pain. The history is provided by the patient.  Ankle Pain  Pertinent negatives include no numbness, no loss of motion, no loss of sensation and no tingling. The symptoms are aggravated by bearing weight and palpation. He has tried nothing for the symptoms.    Past Medical History  Diagnosis Date  . Arthritis   . Diabetes mellitus   . MRSA (methicillin resistant Staphylococcus aureus)   . Hepatitis C   . Blood transfusion     History reviewed. No pertinent past surgical history.  History reviewed. No pertinent family history.  History  Substance Use Topics  . Smoking status: Never Smoker   . Smokeless tobacco: Not on file  . Alcohol Use: No      Review of Systems  Constitutional: Negative for fever and chills.  Gastrointestinal: Negative for nausea and vomiting.  Musculoskeletal: Positive for joint swelling and gait problem.  Skin: Negative for color change.  Neurological: Negative for tingling and numbness.    Allergies  Review of patient's allergies indicates no known allergies.  Home Medications   Current Outpatient Rx  Name Route Sig Dispense Refill  . CLINDAMYCIN HCL 150 MG PO CAPS Oral Take 150 mg by mouth every 6 (six) hours.    Marland Kitchen GLIPIZIDE 5 MG PO TABS Oral Take 5 mg by mouth 2 (two) times daily before a meal.    . METFORMIN HCL  500 MG PO TABS Oral Take 1,000 mg by mouth 2 (two) times daily with a meal.       BP 151/96  Pulse 85  Temp(Src) 97.7 F (36.5 C) (Oral)  Resp 20  SpO2 98%  Physical Exam  Nursing note and vitals reviewed. Constitutional: He appears well-developed and well-nourished. No distress.  HENT:  Head: Normocephalic and atraumatic.  Cardiovascular: Normal rate, regular rhythm and normal heart sounds.   Pulmonary/Chest: Effort normal and breath sounds normal. No respiratory distress.  Musculoskeletal:       Left ankle: He exhibits swelling. He exhibits no ecchymosis and normal pulse. tenderness. Lateral malleolus and medial malleolus tenderness found. Achilles tendon normal.       Swelling of the lateral and medial malleolus. Dorsal pedis pulse 2+ bilaterally. Distal sensation intact bilaterally Pain with ROM of left ankle.  Neurological: He is alert. No sensory deficit.  Skin: Skin is warm and dry. He is not diaphoretic. No erythema.  Psychiatric: He has a normal mood and affect.    ED Course  Procedures (including critical care time)  Labs Reviewed - No data to display Dg Ankle Complete Left  11/25/2011  *RADIOLOGY REPORT*  Clinical Data: Fall.  Ankle injury and pain.  Lateral soft tissue swelling.  LEFT ANKLE COMPLETE - 3+ VIEW  Comparison: None.  Findings: Prominent soft tissue swelling is seen overlying the lateral malleolus anterior to the ankle  joint.  There are tiny corticated ossific densities seen along the medial malleolus, which have appearance of old avulsion fracture fragments.  No acute fracture identified.  No other significant bone abnormality noted. Talus is centered within the ankle mortise.  IMPRESSION: Lateral and anterior soft tissue swelling.  No acute fracture or dislocation.  Original Report Authenticated By: Danae Orleans, M.D.     1. Ankle sprain     Patient given ankle ASO and pain medication while in the ED.  Patient declined crutches because he has crutches  at home.  MDM  Xray of ankle negative for fracture.  Suspect ankle sprain.  Patient neurovascularly intact.          Pascal Lux [place], PA-C 11/26/11 [phone]

## 2011-11-25 NOTE — Discharge Instructions (Signed)
Be sure to read and understand instructions below prior to leaving the hospital. If your symptoms persist without any improvement in 1 week it is recommended that you follow up with orthopedics listed above. Use your pain medication as prescribed and do not operate heavy machinery while on pain medication. Note that your pain medication contains acetaminophen (Tylenol) & its is not reccommended that you use additional acetaminophen (Tylenol) while taking this medication.  Ankle Sprain  An ankle sprain is an injury to the ligaments that hold the ankle joint together. Your X-ray today showed no evidence of fracture, however keep all follow-up appointments with an orthopedic specialist to have follow-up X-rays, because as we discussed fractures may not appear until 3 days after the acute injury.    TREATMENT  Rest, ice, elevation, and compression are the basic modes of treatment.    HOME CARE INSTRUCTIONS  Apply ice to the sore area for 15 to 20 minutes, 3 to 4 times per day. Do this while you are awake for the first 2 days, or as directed. This can be stopped when the swelling goes away. Put the ice in a plastic bag and place a towel between the bag of ice and your skin.  Keep your leg elevated when possible to lessen swelling.  If your caregiver recommends crutches, use them as instructed for 1 week. Then, you may walk on your ankle weight bearing as tolerated.  You may take off your ankle stabilizer at night and to take a shower or bath. Wiggle your toes in the splint several times per day if you are able.  Do not drive a vehicle on pain medication. ACTIVITY:            - Weight bearing as tolerated            - Exercises should be limited to pain free range of motion            - Can start mobilization by tracing the alphabet with your foot in the air.       SEEK MEDICAL CARE IF:  You have an increase in bruising, swelling, or pain.  Your toes feel cold.  Pain relief is not achieved with  medications.  EMERGENCY:: Your toes are numb or blue or you have severe pain.  MAKE SURE YOU:  Understand these instructions.  Will watch your condition.  Will get help right away if you are not doing well or get worse   COLD THERAPY DIRECTIONS:  Ice or gel packs can be used to reduce both pain and swelling. Ice is the most helpful within the first 24 to 48 hours after an injury or flareup from overusing a muscle or joint.  Ice is effective, has very few side effects, and is safe for most people to use.   If you expose your skin to cold temperatures for too long or without the proper protection, you can damage your skin or nerves. Watch for signs of skin damage due to cold.   HOME CARE INSTRUCTIONS  Follow these tips to use ice and cold packs safely.  Place a dry or damp towel between the ice and skin. A damp towel will cool the skin more quickly, so you may need to shorten the time that the ice is used.  For a more rapid response, add gentle compression to the ice.  Ice for no more than 10 to 20 minutes at a time. The bonier the area you are icing, the less  time it will take to get the benefits of ice.  Check your skin after 5 minutes to make sure there are no signs of a poor response to cold or skin damage.  Rest 20 minutes or more in between uses.  Once your skin is numb, you can end your treatment. You can test numbness by very lightly touching your skin. The touch should be so light that you do not see the skin dimple from the pressure of your fingertip. When using ice, most people will feel these normal sensations in this order: cold, burning, aching, and numbness.  Do not use ice on someone who cannot communicate their responses to pain, such as small children or people with dementia.   HOW TO MAKE AN ICE PACK  To make an ice pack, do one of the following:  Place crushed ice or a bag of frozen vegetables in a sealable plastic bag. Squeeze out the excess air. Place this bag inside  another plastic bag. Slide the bag into a pillowcase or place a damp towel between your skin and the bag.  Mix 3 parts water with 1 part rubbing alcohol. Freeze the mixture in a sealable plastic bag. When you remove the mixture from the freezer, it will be slushy. Squeeze out the excess air. Place this bag inside another plastic bag. Slide the bag into a pillowcase or place a damp towel between your sk 

## 2011-11-27 NOTE — ED Provider Notes (Signed)
Medical screening examination/treatment/procedure(s) were performed by non-physician practitioner and as supervising physician I was immediately available for consultation/collaboration. Devoria Albe, MD, Armando Gang   Ward Givens, MD 11/27/11 [phone]

## 2012-03-21 ENCOUNTER — Ambulatory Visit: Payer: Self-pay

## 2013-01-19 ENCOUNTER — Emergency Department: Payer: Self-pay | Admitting: Emergency Medicine

## 2013-01-30 ENCOUNTER — Emergency Department: Payer: Self-pay | Admitting: Emergency Medicine

## 2013-01-30 LAB — CBC
HCT: 42.9 % (ref 40.0–52.0)
HGB: 15.3 g/dL (ref 13.0–18.0)
MCV: 87 fL (ref 80–100)
RDW: 12.5 % (ref 11.5–14.5)
WBC: 6.5 10*3/uL (ref 3.8–10.6)

## 2013-01-30 LAB — BASIC METABOLIC PANEL
Anion Gap: 7 (ref 7–16)
BUN: 16 mg/dL (ref 7–18)
Chloride: 101 mmol/L (ref 98–107)
EGFR (Non-African Amer.): >60
Glucose: 149 mg/dL — ABNORMAL HIGH (ref 65–99)

## 2014-02-13 ENCOUNTER — Emergency Department (HOSPITAL_COMMUNITY)
Admission: EM | Admit: 2014-02-13 | Discharge: 2014-02-13 | Disposition: A | Discharge status: Home / Self Care | Payer: Medicaid Other | Attending: Emergency Medicine | Admitting: Emergency Medicine

## 2014-02-13 ENCOUNTER — Encounter (HOSPITAL_COMMUNITY): Payer: Self-pay | Admitting: Emergency Medicine

## 2014-02-13 DIAGNOSIS — M5416 Radiculopathy, lumbar region: Secondary | ICD-10-CM

## 2014-02-13 DIAGNOSIS — E119 Type 2 diabetes mellitus without complications: Secondary | ICD-10-CM | POA: Insufficient documentation

## 2014-02-13 DIAGNOSIS — IMO0002 Reserved for concepts with insufficient information to code with codable children: Secondary | ICD-10-CM | POA: Diagnosis not present

## 2014-02-13 DIAGNOSIS — M129 Arthropathy, unspecified: Secondary | ICD-10-CM | POA: Insufficient documentation

## 2014-02-13 DIAGNOSIS — M545 Low back pain, unspecified: Secondary | ICD-10-CM | POA: Diagnosis present

## 2014-02-13 DIAGNOSIS — Z79899 Other long term (current) drug therapy: Secondary | ICD-10-CM | POA: Diagnosis not present

## 2014-02-13 DIAGNOSIS — Z791 Long term (current) use of non-steroidal anti-inflammatories (NSAID): Secondary | ICD-10-CM | POA: Diagnosis not present

## 2014-02-13 DIAGNOSIS — Z8614 Personal history of Methicillin resistant Staphylococcus aureus infection: Secondary | ICD-10-CM | POA: Insufficient documentation

## 2014-02-13 DIAGNOSIS — Z8619 Personal history of other infectious and parasitic diseases: Secondary | ICD-10-CM | POA: Insufficient documentation

## 2014-02-13 MED ORDER — DIAZEPAM 5 MG PO TABS: 5.0000 mg | ORAL_TABLET | Freq: Two times a day (BID) | ORAL | Status: DC

## 2014-02-13 MED ORDER — KETOROLAC TROMETHAMINE 60 MG/2ML IM SOLN
30.0000 mg | Freq: Once | INTRAMUSCULAR | Status: AC
Start: 2014-02-13 — End: 2014-02-13
  Administered 2014-02-13: 30 mg via INTRAMUSCULAR
  Filled 2014-02-13: qty 2

## 2014-02-13 MED ORDER — TRAMADOL HCL 50 MG PO TABS
50.0000 mg | ORAL_TABLET | Freq: Once | ORAL | Status: AC
  Administered 2014-02-13: 50 mg via ORAL
  Filled 2014-02-13: qty 1

## 2014-02-13 MED ORDER — KETOROLAC TROMETHAMINE 10 MG PO TABS: 10.0000 mg | ORAL_TABLET | Freq: Four times a day (QID) | ORAL | Status: DC | PRN

## 2014-02-13 MED ORDER — DIAZEPAM 5 MG PO TABS
5.0000 mg | ORAL_TABLET | Freq: Once | ORAL | Status: AC
  Administered 2014-02-13: 5 mg via ORAL
  Filled 2014-02-13: qty 1

## 2014-02-13 MED ORDER — TRAMADOL HCL 50 MG PO TABS: 50.0000 mg | ORAL_TABLET | Freq: Four times a day (QID) | ORAL | Status: DC | PRN

## 2014-02-13 NOTE — ED Notes (Signed)
Pt also reports tingling to his upper neck through his spine. Pt is concerned he has a "slipped disc.'

## 2014-02-13 NOTE — Discharge Instructions (Signed)
°  Do not combine ketorolac (toradol) with any other NSAID (motrin, ibuprofen, Advil, aleve , aspirin, naproxen etc.) Take fist ketorolac pill tomorrow, you have already had a shot today  Take valium and/or Tramadol  for breakthrough pain, do not drink alcohol, drive, care for children or do other critical tasks while taking valium and/or Tramadol.  Do not hesitate to return to the Emergency Department for any new, worsening or concerning symptoms.   If you do not have a primary care doctor you can establish one at the   [facility]: [address] [place] [address] [phone]  After you establish care. Let them know you were seen in the emergency room. They must obtain records for further management.    Back Pain, Adult Back pain is very common. The pain often gets better over time. The cause of back pain is usually not dangerous. Most people can learn to manage their back pain on their own.  HOME CARE   Stay active. Start with short walks on flat ground if you can. Try to walk farther each day.  Do not sit, drive, or stand in one place for more than 30 minutes. Do not stay in bed.  Do not avoid exercise or work. Activity can help your back heal faster.  Be careful when you bend or lift an object. Bend at your knees, keep the object close to you, and do not twist.  Sleep on a firm mattress. Lie on your side, and bend your knees. If you lie on your back, put a pillow under your knees.  Only take medicines as told by your doctor.  Put ice on the injured area.  Put ice in a plastic bag.  Place a towel between your skin and the bag.  Leave the ice on for 15-20 minutes, 03-04 times a day for the first 2 to 3 days. After that, you can switch between ice and heat packs.  Ask your doctor about back exercises or massage.  Avoid feeling anxious or stressed. Find good ways to deal with stress, such as exercise. GET HELP RIGHT AWAY IF:   Your pain does not go  away with rest or medicine.  Your pain does not go away in 1 week.  You have new problems.  You do not feel well.  The pain spreads into your legs.  You cannot control when you poop (bowel movement) or pee (urinate).  Your arms or legs feel weak or lose feeling (numbness).  You feel sick to your stomach (nauseous) or throw up (vomit).  You have belly (abdominal) pain.  You feel like you may pass out (faint). MAKE SURE YOU:   Understand these instructions.  Will watch your condition.  Will get help right away if you are not doing well or get worse. Document Released: 02/10/2008 Document Revised: 11/16/2011 Document Reviewed: 01/12/2011 [facility] Patient Information 2014 [place], [place].

## 2014-02-13 NOTE — ED Provider Notes (Signed)
CSN: [id number]     Arrival date & time 02/13/14  1646 History   None    This chart was scribed for non-physician practitioner, Wynetta Emery  PA-C working with Audree Camel, MD by Arlan Organ, ED Scribe. This patient was seen in room TR06C/TR06C and the patient's care was started at 6:33 PM.   Chief Complaint  Patient presents with  . Back Pain   The history is provided by the patient. No language interpreter was used.    HPI Comments: Frederick Morales is a 58 y.o. male with a PMHx of non-insulin dependant DM and Hepatitis C who presents to the Emergency Department complaining of sudden onset, intermittent, moderate L sided lower back pain x 3 days that has progressively worsened. He denies any recent trauma or injury. Pt states this pain radiates down the L lower extremity. States pain is exacerbated when attempting to sit erect and weight bearing. No alleviating factors at this time. He has tried OTC Aspirin without any noticeable improvement. He denies any fever, chills, dysuria, weakness, loss of sensation, or numbness. No urinary or bowel incontinence. No history of cancer. He denies any previous or current IV drug use. He denies any known allergies to medications. Pt is currently taking prescribed Metformin as directed for diabetes mellitus. Pt has no other pertinent past medical history. No other concerns this visit.  Pt is followed by a physician at [facility] for his Hepatitis C  Past Medical History  Diagnosis Date  . Arthritis   . Diabetes mellitus   . MRSA (methicillin resistant Staphylococcus aureus)   . Hepatitis C   . Blood transfusion    History reviewed. No pertinent past surgical history. History reviewed. No pertinent family history. History  Substance Use Topics  . Smoking status: Never Smoker   . Smokeless tobacco: Not on file  . Alcohol Use: No    Review of Systems  Constitutional: Negative for fever and chills.  HENT: Negative for congestion.   Eyes:  Negative for redness.  Respiratory: Negative for cough.   Gastrointestinal: Negative for nausea and vomiting.  Musculoskeletal: Positive for back pain.  Skin: Negative for rash.  Neurological: Negative for weakness and numbness.  Psychiatric/Behavioral: Negative for confusion.      Allergies  Review of patient's allergies indicates no known allergies.  Home Medications   Prior to Admission medications   Medication Sig Start Date End Date Taking? Authorizing Provider  diazepam (VALIUM) 5 MG tablet Take 1 tablet (5 mg total) by mouth 2 (two) times daily. 02/13/14   Digby Groeneveld, PA-C  glipiZIDE (GLUCOTROL) 5 MG tablet Take 5 mg by mouth 2 (two) times daily before a meal.    Historical Provider, MD  ketorolac (TORADOL) 10 MG tablet Take 1 tablet (10 mg total) by mouth every 6 (six) hours as needed (Take with food. Do not take more than 4 per day. Do not take for longer than 5 days). 02/13/14   Leonor Darnell, PA-C  metFORMIN (GLUCOPHAGE) 500 MG tablet Take 1,000 mg by mouth 2 (two) times daily with a meal.     Historical Provider, MD  traMADol (ULTRAM) 50 MG tablet Take 1 tablet (50 mg total) by mouth every 6 (six) hours as needed. 02/13/14   Herndon Grill, PA-C   Triage Vitals: BP 126/80  Pulse 91  Temp(Src) 97.4 F (36.3 C) (Oral)  SpO2 97%   Physical Exam  Nursing note and vitals reviewed. Constitutional: He is oriented to person, place, and time. He  appears well-developed and well-nourished. No distress.  HENT:  Head: Normocephalic.  Eyes: Conjunctivae and EOM are normal.  Cardiovascular: Normal rate.   Pulmonary/Chest: Effort normal. No stridor.  Musculoskeletal: Normal range of motion.  No point tenderness to percussion of lumbar spinal processes. L lumbar Tenderness to palpation and spasm. Strength is 5 out of 5 to bilateral lower extremities at hip and knee; extensor hallucis longus 5 out of 5. Ankle strength 5 out of 5, no clonus, neurovascularly intact. No saddle  anaesthesia. Patellar reflexes are 2+ bilaterally.   Neurological: He is alert and oriented to person, place, and time.  Psychiatric: He has a normal mood and affect.    ED Course  Procedures (including critical care time)  DIAGNOSTIC STUDIES: Oxygen Saturation is 97% on RA, Normal by my interpretation.    COORDINATION OF CARE: 6:32 PM- Will give Toradol and Valium at discharge to manage symptoms. Discussed treatment plan with pt at bedside and pt agreed to plan.     Labs Review Labs Reviewed - No data to display  Imaging Review No results found.   EKG Interpretation None      MDM   Final diagnoses:  Lumbar radicular pain    Filed Vitals:   02/13/14 1652  BP: 126/80  Pulse: 91  Temp: 97.4 F (36.3 C)  TempSrc: Oral  SpO2: 97%    Medications  ketorolac (TORADOL) injection 30 mg (not administered)  diazepam (VALIUM) tablet 5 mg (not administered)  traMADol (ULTRAM) tablet 50 mg (not administered)    Frederick EvansRobert Morales is a 58 y.o. male presenting with left lower back pain radiating down the leg. Neuro exam is nonfocal.  back pain.  No neurological deficits and normal neuro exam.  Patient can walk but states is painful.  No loss of bowel or bladder control.  No concern for cauda equina.  No fever, night sweats, weight loss, h/o cancer, IVDU.  Extensive discussion on not combining Toradol with any other NSAIDs. Patient verbalized his understanding.  Evaluation does not show pathology that would require ongoing emergent intervention or inpatient treatment. Pt is hemodynamically stable and mentating appropriately. Discussed findings and plan with patient/guardian, who agrees with care plan. All questions answered. Return precautions discussed and outpatient follow up given.   New Prescriptions   DIAZEPAM (VALIUM) 5 MG TABLET    Take 1 tablet (5 mg total) by mouth 2 (two) times daily.   KETOROLAC (TORADOL) 10 MG TABLET    Take 1 tablet (10 mg total) by mouth every 6 (six)  hours as needed (Take with food. Do not take more than 4 per day. Do not take for longer than 5 days).   TRAMADOL (ULTRAM) 50 MG TABLET    Take 1 tablet (50 mg total) by mouth every 6 (six) hours as needed.    I personally performed the services described in this documentation, which was scribed in my presence. The recorded information has been reviewed and is accurate.    Wynetta Emeryicole Justo Hengel, PA-C 02/13/14 1848

## 2014-02-13 NOTE — ED Notes (Signed)
Pt presents with Left lower back pain radiating down into his Left leg x3 days. Pt denies any injury, problems with bowel and bladder.

## 2014-02-13 NOTE — ED Notes (Signed)
Pt verbalizes understanding of d/c instructions and denies any further needs at this time. 

## 2014-02-14 NOTE — ED Provider Notes (Signed)
Medical screening examination/treatment/procedure(s) were performed by non-physician practitioner and as supervising physician I was immediately available for consultation/collaboration.   EKG Interpretation None        Gene Colee T Torion Hulgan, MD 02/14/14 1539 

## 2014-05-01 ENCOUNTER — Emergency Department: Payer: Self-pay | Admitting: Emergency Medicine

## 2014-05-23 ENCOUNTER — Emergency Department (HOSPITAL_COMMUNITY)
Admission: EM | Admit: 2014-05-23 | Discharge: 2014-05-23 | Disposition: A | Discharge status: Home / Self Care | Payer: Medicaid Other | Attending: Emergency Medicine | Admitting: Emergency Medicine

## 2014-05-23 ENCOUNTER — Encounter (HOSPITAL_COMMUNITY): Payer: Self-pay | Admitting: Emergency Medicine

## 2014-05-23 DIAGNOSIS — R52 Pain, unspecified: Secondary | ICD-10-CM | POA: Diagnosis not present

## 2014-05-23 DIAGNOSIS — E119 Type 2 diabetes mellitus without complications: Secondary | ICD-10-CM | POA: Diagnosis not present

## 2014-05-23 DIAGNOSIS — M545 Low back pain, unspecified: Secondary | ICD-10-CM | POA: Insufficient documentation

## 2014-05-23 DIAGNOSIS — Z8739 Personal history of other diseases of the musculoskeletal system and connective tissue: Secondary | ICD-10-CM | POA: Insufficient documentation

## 2014-05-23 DIAGNOSIS — Z8619 Personal history of other infectious and parasitic diseases: Secondary | ICD-10-CM | POA: Insufficient documentation

## 2014-05-23 DIAGNOSIS — M79609 Pain in unspecified limb: Secondary | ICD-10-CM | POA: Diagnosis not present

## 2014-05-23 DIAGNOSIS — Z79899 Other long term (current) drug therapy: Secondary | ICD-10-CM | POA: Diagnosis not present

## 2014-05-23 DIAGNOSIS — Z8614 Personal history of Methicillin resistant Staphylococcus aureus infection: Secondary | ICD-10-CM | POA: Insufficient documentation

## 2014-05-23 DIAGNOSIS — M549 Dorsalgia, unspecified: Secondary | ICD-10-CM | POA: Insufficient documentation

## 2014-05-23 MED ORDER — TRAMADOL HCL 50 MG PO TABS: 50.0000 mg | ORAL_TABLET | Freq: Four times a day (QID) | ORAL | Status: DC | PRN

## 2014-05-23 MED ORDER — DIAZEPAM 5 MG PO TABS: 5.0000 mg | ORAL_TABLET | Freq: Three times a day (TID) | ORAL | Status: DC | PRN

## 2014-05-23 MED ORDER — KETOROLAC TROMETHAMINE 60 MG/2ML IM SOLN
60.0000 mg | Freq: Once | INTRAMUSCULAR | Status: AC
  Administered 2014-05-23: 60 mg via INTRAMUSCULAR
  Filled 2014-05-23: qty 2

## 2014-05-23 MED ORDER — DIAZEPAM 5 MG PO TABS
5.0000 mg | ORAL_TABLET | Freq: Once | ORAL | Status: AC
  Administered 2014-05-23: 5 mg via ORAL
  Filled 2014-05-23: qty 1

## 2014-05-23 NOTE — Discharge Instructions (Signed)
Try heating pads. Stretches. Massage. Ultram for pain. Valium for spasms. Follow up with primary care doctor.   Back Pain, Adult Low back pain is very common. About 1 in 5 people have back pain.The cause of low back pain is rarely dangerous. The pain often gets better over time.About half of people with a sudden onset of back pain feel better in just 2 weeks. About 8 in 10 people feel better by 6 weeks.  CAUSES Some common causes of back pain include:  Strain of the muscles or ligaments supporting the spine.  Wear and tear (degeneration) of the spinal discs.  Arthritis.  Direct injury to the back. DIAGNOSIS Most of the time, the direct cause of low back pain is not known.However, back pain can be treated effectively even when the exact cause of the pain is unknown.Answering your caregiver's questions about your overall health and symptoms is one of the most accurate ways to make sure the cause of your pain is not dangerous. If your caregiver needs more information, he or she may order lab work or imaging tests (X-rays or MRIs).However, even if imaging tests show changes in your back, this usually does not require surgery. HOME CARE INSTRUCTIONS For many people, back pain returns.Since low back pain is rarely dangerous, it is often a condition that people can learn to [facility] their own.   Remain active. It is stressful on the back to sit or stand in one place. Do not sit, drive, or stand in one place for more than 30 minutes at a time. Take short walks on level surfaces as soon as pain allows.Try to increase the length of time you walk each day.  Do not stay in bed.Resting more than 1 or 2 days can delay your recovery.  Do not avoid exercise or work.Your body is made to move.It is not dangerous to be active, even though your back may hurt.Your back will likely heal faster if you return to being active before your pain is gone.  Pay attention to your body when you bend and lift.  Many people have less discomfortwhen lifting if they bend their knees, keep the load close to their bodies,and avoid twisting. Often, the most comfortable positions are those that put less stress on your recovering back.  Find a comfortable position to sleep. Use a firm mattress and lie on your side with your knees slightly bent. If you lie on your back, put a pillow under your knees.  Only take over-the-counter or prescription medicines as directed by your caregiver. Over-the-counter medicines to reduce pain and inflammation are often the most helpful.Your caregiver may prescribe muscle relaxant drugs.These medicines help dull your pain so you can more quickly return to your normal activities and healthy exercise.  Put ice on the injured area.  Put ice in a plastic bag.  Place a towel between your skin and the bag.  Leave the ice on for 15-20 minutes, 03-04 times a day for the first 2 to 3 days. After that, ice and heat may be alternated to reduce pain and spasms.  Ask your caregiver about trying back exercises and gentle massage. This may be of some benefit.  Avoid feeling anxious or stressed.Stress increases muscle tension and can worsen back pain.It is important to recognize when you are anxious or stressed and learn ways to manage it.Exercise is a great option. SEEK MEDICAL CARE IF:  You have pain that is not relieved with rest or medicine.  You have pain that  does not improve in 1 week.  You have new symptoms.  You are generally not feeling well. SEEK IMMEDIATE MEDICAL CARE IF:   You have pain that radiates from your back into your legs.  You develop new bowel or bladder control problems.  You have unusual weakness or numbness in your arms or legs.  You develop nausea or vomiting.  You develop abdominal pain.  You feel faint. Document Released: 08/24/2005 Document Revised: 02/23/2012 Document Reviewed: 12/26/2013 [facility] Patient Information 2015 [place], [place].  This information is not intended to replace advice given to you by your health care provider. Make sure you discuss any questions you have with your health care provider.  Back Exercises Back exercises help treat and prevent back injuries. The goal of back exercises is to increase the strength of your abdominal and back muscles and the flexibility of your back. These exercises should be started when you no longer have back pain. Back exercises include:  Pelvic Tilt. Lie on your back with your knees bent. Tilt your pelvis until the lower part of your back is against the floor. Hold this position 5 to 10 sec and repeat 5 to 10 times.  Knee to Chest. Pull first 1 knee up against your chest and hold for 20 to 30 seconds, repeat this with the other knee, and then both knees. This may be done with the other leg straight or bent, whichever feels better.  Sit-Ups or Curl-Ups. Bend your knees 90 degrees. Start with tilting your pelvis, and do a partial, slow sit-up, lifting your trunk only 30 to 45 degrees off the floor. Take at least 2 to 3 seconds for each sit-up. Do not do sit-ups with your knees out straight. If partial sit-ups are difficult, simply do the above but with only tightening your abdominal muscles and holding it as directed.  Hip-Lift. Lie on your back with your knees flexed 90 degrees. Push down with your feet and shoulders as you raise your hips a couple inches off the floor; hold for 10 seconds, repeat 5 to 10 times.  Back arches. Lie on your stomach, propping yourself up on bent elbows. Slowly press on your hands, causing an arch in your low back. Repeat 3 to 5 times. Any initial stiffness and discomfort should lessen with repetition over time.  Shoulder-Lifts. Lie face down with arms beside your body. Keep hips and torso pressed to floor as you slowly lift your head and shoulders off the floor. Do not overdo your exercises, especially in the beginning. Exercises may cause you some mild  back discomfort which lasts for a few minutes; however, if the pain is more severe, or lasts for more than 15 minutes, do not continue exercises until you see your caregiver. Improvement with exercise therapy for back problems is slow.  See your caregivers for assistance with developing a proper back exercise program. Document Released: 10/01/2004 Document Revised: 11/16/2011 Document Reviewed: 06/25/2011 [facility] Patient Information 2015 [place], [place]. This information is not intended to replace advice given to you by your health care provider. Make sure you discuss any questions you have with your health care provider.
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
## 2014-05-29 NOTE — ED Provider Notes (Signed)
Medical screening examination/treatment/procedure(s) were performed by non-physician practitioner and as supervising physician I was immediately available for consultation/collaboration.   EKG Interpretation None        Dudley Mages, MD 05/29/14 1537 

## 2014-06-28 ENCOUNTER — Emergency Department: Payer: Self-pay | Admitting: Emergency Medicine

## 2014-06-28 LAB — ETHANOL

## 2014-07-22 ENCOUNTER — Encounter (HOSPITAL_COMMUNITY): Payer: Self-pay

## 2014-07-22 ENCOUNTER — Emergency Department (HOSPITAL_COMMUNITY): Payer: Medicaid Other

## 2014-07-22 ENCOUNTER — Emergency Department (HOSPITAL_COMMUNITY)
Admission: EM | Admit: 2014-07-22 | Discharge: 2014-07-22 | Disposition: A | Discharge status: Home / Self Care | Payer: Medicaid Other | Attending: Emergency Medicine | Admitting: Emergency Medicine

## 2014-07-22 DIAGNOSIS — R0789 Other chest pain: Secondary | ICD-10-CM | POA: Diagnosis not present

## 2014-07-22 DIAGNOSIS — Z8739 Personal history of other diseases of the musculoskeletal system and connective tissue: Secondary | ICD-10-CM | POA: Insufficient documentation

## 2014-07-22 DIAGNOSIS — Z8619 Personal history of other infectious and parasitic diseases: Secondary | ICD-10-CM | POA: Diagnosis not present

## 2014-07-22 DIAGNOSIS — Z79899 Other long term (current) drug therapy: Secondary | ICD-10-CM | POA: Insufficient documentation

## 2014-07-22 DIAGNOSIS — R079 Chest pain, unspecified: Secondary | ICD-10-CM | POA: Diagnosis present

## 2014-07-22 DIAGNOSIS — Z8614 Personal history of Methicillin resistant Staphylococcus aureus infection: Secondary | ICD-10-CM | POA: Insufficient documentation

## 2014-07-22 DIAGNOSIS — E119 Type 2 diabetes mellitus without complications: Secondary | ICD-10-CM | POA: Diagnosis not present

## 2014-07-22 LAB — CBC
HCT: 42.2 % (ref 39.0–52.0)
HEMOGLOBIN: 14.5 g/dL (ref 13.0–17.0)
MCH: 30.1 pg (ref 26.0–34.0)
MCHC: 34.4 g/dL (ref 30.0–36.0)
MCV: 87.7 fL (ref 78.0–100.0)
Platelets: 167 10*3/uL (ref 150–400)
RBC: 4.81 MIL/uL (ref 4.22–5.81)
RDW: 12 % (ref 11.5–15.5)
WBC: 4.2 10*3/uL (ref 4.0–10.5)

## 2014-07-22 LAB — BASIC METABOLIC PANEL
Anion gap: 13 (ref 5–15)
BUN: 12 mg/dL (ref 6–23)
CALCIUM: 9.6 mg/dL (ref 8.4–10.5)
CO2: 27 mEq/L (ref 19–32)
Chloride: 93 mEq/L — ABNORMAL LOW (ref 96–112)
Creatinine, Ser: 0.89 mg/dL (ref 0.50–1.35)
GLUCOSE: 291 mg/dL — AB (ref 70–99)
Potassium: 3.9 mEq/L (ref 3.7–5.3)
SODIUM: 133 meq/L — AB (ref 137–147)

## 2014-07-22 LAB — I-STAT TROPONIN, ED: TROPONIN I, POC: 0.01 ng/mL (ref 0.00–0.08)

## 2014-07-22 MED ORDER — OXYCODONE-ACETAMINOPHEN 5-325 MG PO TABS: 2.0000 | ORAL_TABLET | ORAL | Status: DC | PRN

## 2014-07-22 MED ORDER — KETOROLAC TROMETHAMINE 60 MG/2ML IM SOLN
60.0000 mg | Freq: Once | INTRAMUSCULAR | Status: AC
  Administered 2014-07-22: 60 mg via INTRAMUSCULAR
  Filled 2014-07-22: qty 2

## 2014-07-22 MED ORDER — METHOCARBAMOL 500 MG PO TABS: 500.0000 mg | ORAL_TABLET | Freq: Two times a day (BID) | ORAL | Status: DC

## 2014-07-22 NOTE — ED Notes (Signed)
Patient transported to X-ray 

## 2014-07-22 NOTE — Discharge Instructions (Signed)
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
## 2014-10-18 HISTORY — PX: DENTAL SURGERY: SHX609

## 2014-12-27 ENCOUNTER — Emergency Department (HOSPITAL_COMMUNITY)
Admission: EM | Admit: 2014-12-27 | Discharge: 2014-12-27 | Disposition: A | Discharge status: Home / Self Care | Payer: Medicaid Other | Attending: Emergency Medicine | Admitting: Emergency Medicine

## 2014-12-27 ENCOUNTER — Encounter (HOSPITAL_COMMUNITY): Payer: Self-pay | Admitting: Cardiology

## 2014-12-27 DIAGNOSIS — X58XXXA Exposure to other specified factors, initial encounter: Secondary | ICD-10-CM | POA: Diagnosis not present

## 2014-12-27 DIAGNOSIS — E119 Type 2 diabetes mellitus without complications: Secondary | ICD-10-CM | POA: Insufficient documentation

## 2014-12-27 DIAGNOSIS — Y929 Unspecified place or not applicable: Secondary | ICD-10-CM | POA: Diagnosis not present

## 2014-12-27 DIAGNOSIS — Z79899 Other long term (current) drug therapy: Secondary | ICD-10-CM | POA: Insufficient documentation

## 2014-12-27 DIAGNOSIS — M545 Low back pain, unspecified: Secondary | ICD-10-CM

## 2014-12-27 DIAGNOSIS — Y998 Other external cause status: Secondary | ICD-10-CM | POA: Diagnosis not present

## 2014-12-27 DIAGNOSIS — Y9389 Activity, other specified: Secondary | ICD-10-CM | POA: Diagnosis not present

## 2014-12-27 DIAGNOSIS — Z8739 Personal history of other diseases of the musculoskeletal system and connective tissue: Secondary | ICD-10-CM | POA: Insufficient documentation

## 2014-12-27 DIAGNOSIS — Z8619 Personal history of other infectious and parasitic diseases: Secondary | ICD-10-CM | POA: Insufficient documentation

## 2014-12-27 DIAGNOSIS — Z8614 Personal history of Methicillin resistant Staphylococcus aureus infection: Secondary | ICD-10-CM | POA: Diagnosis not present

## 2014-12-27 DIAGNOSIS — S3992XA Unspecified injury of lower back, initial encounter: Secondary | ICD-10-CM | POA: Insufficient documentation

## 2014-12-27 DIAGNOSIS — Z880 Allergy status to penicillin: Secondary | ICD-10-CM | POA: Insufficient documentation

## 2014-12-27 MED ORDER — KETOROLAC TROMETHAMINE 60 MG/2ML IM SOLN
60.0000 mg | Freq: Once | INTRAMUSCULAR | Status: AC
  Administered 2014-12-27: 60 mg via INTRAMUSCULAR
  Filled 2014-12-27: qty 2

## 2014-12-27 MED ORDER — LIDOCAINE 5 % EX PTCH: 1.0000 | MEDICATED_PATCH | CUTANEOUS | Status: DC

## 2014-12-27 NOTE — Discharge Instructions (Signed)
It appears that you are taking the correct medications for the back pain. Please take the ibuprofen prescribed by Dr. Marlana Salvage today. Apply Ice and heat as directed. I am discharging you with Lidoderm patches. SEEK IMMEDIATE MEDICAL ATTENTION IF: New numbness, tingling, weakness, or problem with the use of your arms or legs.  Severe back pain not relieved with medications.  Change in bowel or bladder control.  Increasing pain in any areas of the body (such as chest or abdominal pain).  Shortness of breath, dizziness or fainting.  Nausea (feeling sick to your stomach), vomiting, fever, or sweats.  Heat Therapy Heat therapy can help ease sore, stiff, injured, and tight muscles and joints. Heat relaxes your muscles, which may help ease your pain.  RISKS AND COMPLICATIONS If you have any of the following conditions, do not use heat therapy unless your health care provider has approved:  Poor circulation.  Healing wounds or scarred skin in the area being treated.  Diabetes, heart disease, or high blood pressure.  Not being able to feel (numbness) the area being treated.  Unusual swelling of the area being treated.  Active infections.  Blood clots.  Cancer.  Inability to communicate pain. This may include young children and people who have problems with their brain function (dementia).  Pregnancy. Heat therapy should only be used on old, pre-existing, or long-lasting (chronic) injuries. Do not use heat therapy on new injuries unless directed by your health care provider. HOW TO USE HEAT THERAPY There are several different kinds of heat therapy, including:  Moist heat pack.  Warm water bath.  Hot water bottle.  Electric heating pad.  Heated gel pack.  Heated wrap.  Electric heating pad. Use the heat therapy method suggested by your health care provider. Follow your health care provider's instructions on when and how to use heat therapy. GENERAL HEAT THERAPY  RECOMMENDATIONS  Do not sleep while using heat therapy. Only use heat therapy while you are awake.  Your skin may turn pink while using heat therapy. Do not use heat therapy if your skin turns red.  Do not use heat therapy if you have new pain.  High heat or long exposure to heat can cause burns. Be careful when using heat therapy to avoid burning your skin.  Do not use heat therapy on areas of your skin that are already irritated, such as with a rash or sunburn. SEEK MEDICAL CARE IF:  You have blisters, redness, swelling, or numbness.  You have new pain.  Your pain is worse. MAKE SURE YOU:  Understand these instructions.  Will watch your condition.  Will get help right away if you are not doing well or get worse. Document Released: 11/16/2011 Document Revised: 01/08/2014 Document Reviewed: 10/17/2013 [facility] Patient Information 2015 [place], [place]. This information is not intended to replace advice given to you by your health care provider. Make sure you discuss any questions you have with your health care provider.  Lumbosacral Strain Lumbosacral strain is a strain of any of the parts that make up your lumbosacral vertebrae. Your lumbosacral vertebrae are the bones that make up the lower third of your backbone. Your lumbosacral vertebrae are held together by muscles and tough, fibrous tissue (ligaments).  CAUSES  A sudden blow to your back can cause lumbosacral strain. Also, anything that causes an excessive stretch of the muscles in the low back can cause this strain. This is typically seen when people exert themselves strenuously, fall, lift heavy objects, bend, or crouch  repeatedly. RISK FACTORS  Physically demanding work.  Participation in pushing or pulling sports or sports that require a sudden twist of the back (tennis, golf, baseball).  Weight lifting.  Excessive lower back curvature.  Forward-tilted pelvis.  Weak back or abdominal muscles or both.  Tight  hamstrings. SIGNS AND SYMPTOMS  Lumbosacral strain may cause pain in the area of your injury or pain that moves (radiates) down your leg.  DIAGNOSIS Your health care provider can often diagnose lumbosacral strain through a physical exam. In some cases, you may need tests such as X-ray exams.  TREATMENT  Treatment for your lower back injury depends on many factors that your clinician will have to evaluate. However, most treatment will include the use of anti-inflammatory medicines. HOME CARE INSTRUCTIONS   Avoid hard physical activities (tennis, racquetball, waterskiing) if you are not in proper physical condition for it. This may aggravate or create problems.  If you have a back problem, avoid sports requiring sudden body movements. Swimming and walking are generally safer activities.  Maintain good posture.  Maintain a healthy weight.  For acute conditions, you may put ice on the injured area.  Put ice in a plastic bag.  Place a towel between your skin and the bag.  Leave the ice on for 20 minutes, 2-3 times a day.  When the low back starts healing, stretching and strengthening exercises may be recommended. SEEK MEDICAL CARE IF:  Your back pain is getting worse.  You experience severe back pain not relieved with medicines. SEEK IMMEDIATE MEDICAL CARE IF:   You have numbness, tingling, weakness, or problems with the use of your arms or legs.  There is a change in bowel or bladder control.  You have increasing pain in any area of the body, including your belly (abdomen).  You notice shortness of breath, dizziness, or feel faint.  You feel sick to your stomach (nauseous), are throwing up (vomiting), or become sweaty.  You notice discoloration of your toes or legs, or your feet get very cold. MAKE SURE YOU:   Understand these instructions.  Will watch your condition.  Will get help right away if you are not doing well or get worse. Document Released: 06/03/2005  Document Revised: 08/29/2013 Document Reviewed: 04/12/2013 [facility] Patient Information 2015 [place]. This information is not intended to replace advice given to you by your health care provider. Make sure you discuss any questions you have with your health care provider.

## 2014-12-27 NOTE — ED Notes (Signed)
Pt reports he was unloading a pressure washer a couple of days ago and hurt his lower back on the right side.

## 2014-12-27 NOTE — ED Provider Notes (Signed)
CSN: [id number]     Arrival date & time 12/27/14  1810 History  This chart was scribed for a non-physician practitioner, Arthor Captain, PA-C working with [name], MD by [place] Peace, ED Scribe. The patient was seen in [facility]. The patient's care was started at 7:33 PM.    Chief Complaint  Patient presents with  . Back Pain      Patient is a 59 y.o. male presenting with back pain. The history is provided by the patient. No language interpreter was used.  Back Pain Associated symptoms: no dysuria    HPI Comments: Frederick Morales is a 59 y.o. male who presents to the Emergency Department complaining of right-sided lower back pain over the past few days. Pt explains that he injured back while unloading a pressure washer. Pain exacerbated with sitting down and laying down. No complaints of bowel or urinary incontinence. Pt has not taken any medication to address pain. History of arthritis and DM.   Pt was seen by his PCP at [facility] earlier today about same issue. Pt was discharged with Motrin, Tramadol, and other supportive measures.   Past Medical History  Diagnosis Date  . Arthritis   . Diabetes mellitus   . MRSA (methicillin resistant Staphylococcus aureus)   . Hepatitis C   . Blood transfusion    History reviewed. No pertinent past surgical history. History reviewed. No pertinent family history. History  Substance Use Topics  . Smoking status: Never Smoker   . Smokeless tobacco: Not on file  . Alcohol Use: No    Review of Systems  Gastrointestinal: Negative for diarrhea and constipation.  Genitourinary: Negative for dysuria, urgency, frequency and difficulty urinating.  Musculoskeletal: Positive for back pain.      Allergies  Penicillins  Home Medications   Prior to Admission medications   Medication Sig Start Date End Date Taking? Authorizing Provider  gabapentin (NEURONTIN) 300 MG capsule Take 300 mg by mouth 3 (three) times daily.   Yes Historical Provider,  MD  glipiZIDE (GLUCOTROL) 5 MG tablet Take 2.5 mg by mouth 2 (two) times daily before a meal.    Yes Historical Provider, MD  lisinopril (PRINIVIL,ZESTRIL) 5 MG tablet Take 5 mg by mouth daily.   Yes Historical Provider, MD  metFORMIN (GLUCOPHAGE) 500 MG tablet Take 1,000 mg by mouth 2 (two) times daily with a meal.    Yes Historical Provider, MD  simvastatin (ZOCOR) 5 MG tablet Take 5 mg by mouth at bedtime.   Yes Historical Provider, MD  tamsulosin (FLOMAX) 0.4 MG CAPS capsule Take 0.4 mg by mouth daily.   Yes Historical Provider, MD  traMADol (ULTRAM) 50 MG tablet Take 1 tablet (50 mg total) by mouth every 6 (six) hours as needed. 05/23/14  Yes Tatyana Kirichenko, PA-C  diazepam (VALIUM) 5 MG tablet Take 1 tablet (5 mg total) by mouth 2 (two) times daily. Patient not taking: Reported on 07/22/2014 02/13/14   Joni Reining Pisciotta, PA-C  diazepam (VALIUM) 5 MG tablet Take 1 tablet (5 mg total) by mouth every 8 (eight) hours as needed for anxiety. Patient not taking: Reported on 07/22/2014 05/23/14   Tatyana Kirichenko, PA-C  ketorolac (TORADOL) 10 MG tablet Take 1 tablet (10 mg total) by mouth every 6 (six) hours as needed (Take with food. Do not take more than 4 per day. Do not take for longer than 5 days). Patient not taking: Reported on 07/22/2014 02/13/14   Joni Reining Pisciotta, PA-C  methocarbamol (ROBAXIN) 500 MG tablet Take 1  tablet (500 mg total) by mouth 2 (two) times daily. Patient not taking: Reported on 12/27/2014 07/22/14   Lorre NickAnthony Allen, MD  oxyCODONE-acetaminophen (PERCOCET/ROXICET) 5-325 MG per tablet Take 2 tablets by mouth every 4 (four) hours as needed for severe pain. Patient not taking: Reported on 12/27/2014 07/22/14   Lorre NickAnthony Allen, MD  traMADol (ULTRAM) 50 MG tablet Take 1 tablet (50 mg total) by mouth every 6 (six) hours as needed. Patient not taking: Reported on 12/27/2014 02/13/14   Joni ReiningNicole Pisciotta, PA-C   BP 107/61 mmHg  Pulse 71  Temp(Src) 97.8 F (36.6 C)  Resp 18  Wt 187 lb 5  oz (84.964 kg)  SpO2 99% Physical Exam  Constitutional: He is oriented to person, place, and time. He appears well-developed and well-nourished. No distress.  HENT:  Head: Normocephalic and atraumatic.  Eyes: Conjunctivae and EOM are normal.  Neck: Neck supple. No tracheal deviation present.  Cardiovascular: Normal rate.   Pulmonary/Chest: Effort normal. No respiratory distress.  Musculoskeletal: Normal range of motion. He exhibits tenderness.  Acute lumbar perispinal spasm.   Neurological: He is alert and oriented to person, place, and time.  Skin: Skin is warm and dry.  Psychiatric: He has a normal mood and affect. His behavior is normal.  Nursing note and vitals reviewed.   ED Course  Procedures (including critical care time) Labs Review Labs Reviewed - No data to display  Imaging Review No results found.   EKG Interpretation None     Medications - No data to display  7:35 PM- Treatment plan which includes prescription for "hot and cold" therapy patches was discussed with patient who verbalizes understanding and agrees.   MDM   Final diagnoses:  Right-sided low back pain without sciatica    Patient with back pain.  No neurological deficits and normal neuro exam.  Patient can walk but states is painful.  No loss of bowel or bladder control.  No concern for cauda equina.  No fever, night sweats, weight loss, h/o cancer, IVDU.  RICE protocol and pain medicine indicated and discussed with patient.  ]. She has seen earlier and given medications. Will discharge with Lidoderm patch. [facility] back pain.  I personally performed the services described in this documentation, which was scribed in my presence. The recorded information has been reviewed and is accurate.      [name], PA-C 12/27/14 2242  Linwood DibblesJon Knapp, MD 12/27/14 [phone]

## 2015-01-18 HISTORY — PX: COLONOSCOPY: SHX174
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
## 2015-06-20 ENCOUNTER — Encounter (HOSPITAL_COMMUNITY): Payer: Self-pay | Admitting: Emergency Medicine

## 2015-06-20 ENCOUNTER — Emergency Department (HOSPITAL_COMMUNITY): Payer: Medicaid Other

## 2015-06-20 ENCOUNTER — Inpatient Hospital Stay (HOSPITAL_COMMUNITY)
Admission: EM | Admit: 2015-06-20 | Discharge: 2015-06-25 | DRG: 439 | Disposition: A | Discharge status: Home / Self Care | Payer: Medicaid Other | Attending: Internal Medicine | Admitting: Internal Medicine

## 2015-06-20 DIAGNOSIS — E1142 Type 2 diabetes mellitus with diabetic polyneuropathy: Secondary | ICD-10-CM | POA: Diagnosis not present

## 2015-06-20 DIAGNOSIS — Z79899 Other long term (current) drug therapy: Secondary | ICD-10-CM | POA: Diagnosis not present

## 2015-06-20 DIAGNOSIS — K859 Acute pancreatitis without necrosis or infection, unspecified: Secondary | ICD-10-CM | POA: Diagnosis present

## 2015-06-20 DIAGNOSIS — Z7984 Long term (current) use of oral hypoglycemic drugs: Secondary | ICD-10-CM

## 2015-06-20 DIAGNOSIS — E118 Type 2 diabetes mellitus with unspecified complications: Secondary | ICD-10-CM | POA: Diagnosis not present

## 2015-06-20 DIAGNOSIS — Z87891 Personal history of nicotine dependence: Secondary | ICD-10-CM | POA: Diagnosis not present

## 2015-06-20 DIAGNOSIS — E11649 Type 2 diabetes mellitus with hypoglycemia without coma: Secondary | ICD-10-CM | POA: Diagnosis present

## 2015-06-20 DIAGNOSIS — Z880 Allergy status to penicillin: Secondary | ICD-10-CM | POA: Diagnosis not present

## 2015-06-20 DIAGNOSIS — B1920 Unspecified viral hepatitis C without hepatic coma: Secondary | ICD-10-CM | POA: Diagnosis present

## 2015-06-20 DIAGNOSIS — A4902 Methicillin resistant Staphylococcus aureus infection, unspecified site: Secondary | ICD-10-CM

## 2015-06-20 DIAGNOSIS — R1013 Epigastric pain: Secondary | ICD-10-CM | POA: Diagnosis not present

## 2015-06-20 DIAGNOSIS — I10 Essential (primary) hypertension: Secondary | ICD-10-CM | POA: Diagnosis present

## 2015-06-20 DIAGNOSIS — E871 Hypo-osmolality and hyponatremia: Secondary | ICD-10-CM | POA: Diagnosis present

## 2015-06-20 DIAGNOSIS — Z833 Family history of diabetes mellitus: Secondary | ICD-10-CM

## 2015-06-20 DIAGNOSIS — E1149 Type 2 diabetes mellitus with other diabetic neurological complication: Secondary | ICD-10-CM | POA: Diagnosis present

## 2015-06-20 DIAGNOSIS — E1165 Type 2 diabetes mellitus with hyperglycemia: Secondary | ICD-10-CM | POA: Diagnosis not present

## 2015-06-20 HISTORY — DX: Essential (primary) hypertension: I10

## 2015-06-20 HISTORY — DX: Acute pancreatitis without necrosis or infection, unspecified: K85.90

## 2015-06-20 LAB — CBC WITH DIFFERENTIAL/PLATELET
BASOS ABS: 0 10*3/uL (ref 0.0–0.1)
BASOS PCT: 0 %
EOS ABS: 0.2 10*3/uL (ref 0.0–0.7)
EOS PCT: 2 %
HCT: 45 % (ref 39.0–52.0)
Hemoglobin: 16.1 g/dL (ref 13.0–17.0)
Lymphocytes Relative: 10 %
Lymphs Abs: 1.1 10*3/uL (ref 0.7–4.0)
MCH: 31.1 pg (ref 26.0–34.0)
MCHC: 35.8 g/dL (ref 30.0–36.0)
MCV: 86.9 fL (ref 78.0–100.0)
Monocytes Absolute: 0.9 10*3/uL (ref 0.1–1.0)
Monocytes Relative: 8 %
Neutro Abs: 9.4 10*3/uL — ABNORMAL HIGH (ref 1.7–7.7)
Neutrophils Relative %: 80 %
PLATELETS: 228 10*3/uL (ref 150–400)
RBC: 5.18 MIL/uL (ref 4.22–5.81)
RDW: 12.1 % (ref 11.5–15.5)
WBC: 11.6 10*3/uL — AB (ref 4.0–10.5)

## 2015-06-20 LAB — URINALYSIS, ROUTINE W REFLEX MICROSCOPIC
BILIRUBIN URINE: NEGATIVE
Glucose, UA: NEGATIVE mg/dL
Hgb urine dipstick: NEGATIVE
KETONES UR: 15 mg/dL — AB
LEUKOCYTES UA: NEGATIVE
NITRITE: NEGATIVE
PH: 6 (ref 5.0–8.0)
PROTEIN: NEGATIVE mg/dL
SPECIFIC GRAVITY, URINE: 1.017 (ref 1.005–1.030)
UROBILINOGEN UA: 0.2 mg/dL (ref 0.0–1.0)

## 2015-06-20 LAB — COMPREHENSIVE METABOLIC PANEL
ALT: 21 U/L (ref 17–63)
AST: 21 U/L (ref 15–41)
Albumin: 4.1 g/dL (ref 3.5–5.0)
Alkaline Phosphatase: 52 U/L (ref 38–126)
Anion gap: 11 (ref 5–15)
BUN: 16 mg/dL (ref 6–20)
CHLORIDE: 98 mmol/L — AB (ref 101–111)
CO2: 23 mmol/L (ref 22–32)
CREATININE: 0.86 mg/dL (ref 0.61–1.24)
Calcium: 9.7 mg/dL (ref 8.9–10.3)
GFR calc Af Amer: >60 mL/min (ref >60)
GFR calc non Af Amer: >60 mL/min (ref >60)
Glucose, Bld: 218 mg/dL — ABNORMAL HIGH (ref 65–99)
Potassium: 4.2 mmol/L (ref 3.5–5.1)
SODIUM: 132 mmol/L — AB (ref 135–145)
Total Bilirubin: 0.9 mg/dL (ref 0.3–1.2)
Total Protein: 7.8 g/dL (ref 6.5–8.1)

## 2015-06-20 LAB — LIPASE, BLOOD: Lipase: 550 U/L — ABNORMAL HIGH (ref 22–51)

## 2015-06-20 LAB — LACTIC ACID, PLASMA: LACTIC ACID, VENOUS: 0.8 mmol/L (ref 0.5–2.0)

## 2015-06-20 MED ORDER — SODIUM CHLORIDE 0.9 % IV SOLN: INTRAVENOUS | Status: DC

## 2015-06-20 MED ORDER — SODIUM CHLORIDE 0.9 % IV BOLUS (SEPSIS)
1000.0000 mL | Freq: Once | INTRAVENOUS | Status: AC
  Administered 2015-06-20: 1000 mL via INTRAVENOUS

## 2015-06-20 MED ORDER — ONDANSETRON HCL 4 MG/2ML IJ SOLN
4.0000 mg | Freq: Once | INTRAMUSCULAR | Status: AC
  Administered 2015-06-20: 4 mg via INTRAVENOUS
  Filled 2015-06-20: qty 2

## 2015-06-20 MED ORDER — HYDROMORPHONE HCL 1 MG/ML IJ SOLN
0.5000 mg | Freq: Once | INTRAMUSCULAR | Status: AC
  Administered 2015-06-20: 0.5 mg via INTRAVENOUS
  Filled 2015-06-20: qty 1

## 2015-06-20 MED ORDER — HYDROMORPHONE HCL 1 MG/ML IJ SOLN
1.0000 mg | Freq: Once | INTRAMUSCULAR | Status: AC
  Administered 2015-06-20: 1 mg via INTRAVENOUS
  Filled 2015-06-20: qty 1

## 2015-06-20 MED ORDER — HYDROMORPHONE HCL 1 MG/ML IJ SOLN: 1.0000 mg | Freq: Once | INTRAMUSCULAR | Status: DC

## 2015-06-20 MED ORDER — IOHEXOL 300 MG/ML  SOLN
100.0000 mL | Freq: Once | INTRAMUSCULAR | Status: AC | PRN
  Administered 2015-06-20: 100 mL via INTRAVENOUS

## 2015-06-20 NOTE — ED Provider Notes (Signed)
The pt has hx of Hep C - appy as a child - 3 days of worsening pain in the upper abd - severe with eating - on exam he is ill appearing, tachycardic, and has swevere upper abd pain with guarding but no lower abd pain - no peritonitis.  He has no edeam, clear lungs.    Labs show leukocytosis and pancreatitis - no liver inlammation =- CT pending, fluids, meds and admit for hydrationa dn symtpoms control.  Medical screening examination/treatment/procedure(s) were conducted as a shared visit with non-physician practitioner(s) and myself.  I personally evaluated the patient during the encounter.  Clinical Impression:   Final diagnoses:  Acute pancreatitis, unspecified pancreatitis type          Eber HongBrian Kyreese Chio, MD 06/21/15 [id number]

## 2015-06-20 NOTE — ED Notes (Addendum)
Patient with abdominal pain that started two days ago.  Patient states that he has not been able to get out of bed.  Patient denies any nausea or vomiting.  Patient has been having regular bowel movements.  History of diverticulitis.

## 2015-06-20 NOTE — ED Provider Notes (Signed)
CSN: [id number]     Arrival date & time 06/20/15  2006 History   First MD Initiated Contact with Patient 06/20/15 2102     Chief Complaint  Patient presents with  . Abdominal Pain     (Consider location/radiation/quality/duration/timing/severity/associated sxs/prior Treatment) HPI Comments: Patient with a history of type 2 DM, Hep C (not currently active) presents with progressively worsening abdominal pain for the past 3 days. No fever, nausea, vomiting. Normal bowel movement today, non-bloody, non-melanic. He reports a history of diverticulitis and feels this may be a recurrence. Pain is located in bilateral abdomen and is constant in nature. He denies previous abdominal surgeries.   The history is provided by the patient. No language interpreter was used.    Past Medical History  Diagnosis Date  . Arthritis   . Diabetes mellitus   . MRSA (methicillin resistant Staphylococcus aureus)   . Hepatitis C   . Blood transfusion    History reviewed. No pertinent past surgical history. No family history on file. Social History  Substance Use Topics  . Smoking status: Never Smoker   . Smokeless tobacco: None  . Alcohol Use: No    Review of Systems  Constitutional: Negative for fever and chills.  Respiratory: Negative.  Negative for shortness of breath.   Cardiovascular: Negative.  Negative for chest pain.  Gastrointestinal: Positive for abdominal pain. Negative for nausea and vomiting.  Genitourinary: Negative.  Negative for dysuria and frequency.  Musculoskeletal: Negative.  Negative for back pain.  Skin: Negative.   Neurological: Negative.       Allergies  Penicillins  Home Medications   Prior to Admission medications   Medication Sig Start Date End Date Taking? Authorizing Provider  gabapentin (NEURONTIN) 300 MG capsule Take 300 mg by mouth 3 (three) times daily.   Yes Historical Provider, MD  glipiZIDE (GLUCOTROL) 5 MG tablet Take 2.5 mg by mouth 2 (two) times daily  before a meal.    Yes Historical Provider, MD  lisinopril (PRINIVIL,ZESTRIL) 5 MG tablet Take 5 mg by mouth daily.   Yes Historical Provider, MD  metFORMIN (GLUCOPHAGE) 500 MG tablet Take 1,000 mg by mouth 2 (two) times daily with a meal.    Yes Historical Provider, MD  simvastatin (ZOCOR) 5 MG tablet Take 5 mg by mouth at bedtime.   Yes Historical Provider, MD  tamsulosin (FLOMAX) 0.4 MG CAPS capsule Take 0.8 mg by mouth daily.    Yes Historical Provider, MD  traMADol (ULTRAM) 50 MG tablet Take 1 tablet (50 mg total) by mouth every 6 (six) hours as needed. Patient taking differently: Take 50 mg by mouth every 6 (six) hours as needed for moderate pain.  05/23/14  Yes Tatyana Kirichenko, PA-C  diazepam (VALIUM) 5 MG tablet Take 1 tablet (5 mg total) by mouth 2 (two) times daily. Patient not taking: Reported on 07/22/2014 02/13/14   Joni Reining Pisciotta, PA-C  diazepam (VALIUM) 5 MG tablet Take 1 tablet (5 mg total) by mouth every 8 (eight) hours as needed for anxiety. Patient not taking: Reported on 07/22/2014 05/23/14   Tatyana Kirichenko, PA-C  ketorolac (TORADOL) 10 MG tablet Take 1 tablet (10 mg total) by mouth every 6 (six) hours as needed (Take with food. Do not take more than 4 per day. Do not take for longer than 5 days). Patient not taking: Reported on 07/22/2014 02/13/14   Joni Reining Pisciotta, PA-C  lidocaine (LIDODERM) 5 % Place 1 patch onto the skin daily. Remove & Discard patch within 12 hours  or as directed by MD Patient not taking: Reported on 06/20/2015 12/27/14   Arthor Captain, PA-C  methocarbamol (ROBAXIN) 500 MG tablet Take 1 tablet (500 mg total) by mouth 2 (two) times daily. Patient not taking: Reported on 12/27/2014 07/22/14   Lorre Nick, MD  oxyCODONE-acetaminophen (PERCOCET/ROXICET) 5-325 MG per tablet Take 2 tablets by mouth every 4 (four) hours as needed for severe pain. Patient not taking: Reported on 12/27/2014 07/22/14   Lorre Nick, MD  traMADol (ULTRAM) 50 MG tablet Take 1  tablet (50 mg total) by mouth every 6 (six) hours as needed. Patient not taking: Reported on 12/27/2014 02/13/14   Joni Reining Pisciotta, PA-C   BP 150/114 mmHg  Pulse 123  Temp(Src) 98.1 F (36.7 C) (Oral)  Resp 16  SpO2 97% Physical Exam  Constitutional: He is oriented to person, place, and time. He appears well-developed and well-nourished.  Uncomfortable appearing.  HENT:  Head: Normocephalic.  Neck: Normal range of motion. Neck supple.  Cardiovascular: Regular rhythm.  Tachycardia present.   Pulmonary/Chest: Effort normal and breath sounds normal. He has no wheezes. He has no rales.  Abdominal: Soft. Bowel sounds are normal. There is tenderness. There is no rebound and no guarding.  Diffuse abdominal tenderness. BS are positive in all quadrants. Mildly distended abdomen.  Musculoskeletal: Normal range of motion.  Neurological: He is alert and oriented to person, place, and time.  Skin: Skin is warm and dry. No rash noted.  Psychiatric: He has a normal mood and affect.    ED Course  Procedures (including critical care time) Labs Review Labs Reviewed  CBC WITH DIFFERENTIAL/PLATELET - Abnormal; Notable for the following:    WBC 11.6 (*)    Neutro Abs 9.4 (*)    All other components within normal limits  COMPREHENSIVE METABOLIC PANEL - Abnormal; Notable for the following:    Sodium 132 (*)    Chloride 98 (*)    Glucose, Bld 218 (*)    All other components within normal limits  LIPASE, BLOOD - Abnormal; Notable for the following:    Lipase 550 (*)    All other components within normal limits  URINALYSIS, ROUTINE W REFLEX MICROSCOPIC (NOT AT [facility]) - Abnormal; Notable for the following:    Ketones, ur 15 (*)    All other components within normal limits   Results for orders placed or performed during the hospital encounter of 06/20/15  CBC WITH DIFFERENTIAL  Result Value Ref Range   WBC 11.6 (H) 4.0 - 10.5 K/uL   RBC 5.18 4.22 - 5.81 MIL/uL   Hemoglobin 16.1 13.0 - 17.0 g/dL    HCT 16.1 09.6 - 04.5 %   MCV 86.9 78.0 - 100.0 fL   MCH 31.1 26.0 - 34.0 pg   MCHC 35.8 30.0 - 36.0 g/dL   RDW 40.9 81.1 - 91.4 %   Platelets 228 150 - 400 K/uL   Neutrophils Relative % 80 %   Neutro Abs 9.4 (H) 1.7 - 7.7 K/uL   Lymphocytes Relative 10 %   Lymphs Abs 1.1 0.7 - 4.0 K/uL   Monocytes Relative 8 %   Monocytes Absolute 0.9 0.1 - 1.0 K/uL   Eosinophils Relative 2 %   Eosinophils Absolute 0.2 0.0 - 0.7 K/uL   Basophils Relative 0 %   Basophils Absolute 0.0 0.0 - 0.1 K/uL  Comprehensive metabolic panel  Result Value Ref Range   Sodium 132 (L) 135 - 145 mmol/L   Potassium 4.2 3.5 - 5.1 mmol/L  Chloride 98 (L) 101 - 111 mmol/L   CO2 23 22 - 32 mmol/L   Glucose, Bld 218 (H) 65 - 99 mg/dL   BUN 16 6 - 20 mg/dL   Creatinine, Ser 5.62 0.61 - 1.24 mg/dL   Calcium 9.7 8.9 - 13.0 mg/dL   Total Protein 7.8 6.5 - 8.1 g/dL   Albumin 4.1 3.5 - 5.0 g/dL   AST 21 15 - 41 U/L   ALT 21 17 - 63 U/L   Alkaline Phosphatase 52 38 - 126 U/L   Total Bilirubin 0.9 0.3 - 1.2 mg/dL   GFR calc non Af Amer >60 >60 mL/min   GFR calc Af Amer >60 >60 mL/min   Anion gap 11 5 - 15  Lipase, blood  Result Value Ref Range   Lipase 550 (H) 22 - 51 U/L  Urinalysis, Routine w reflex microscopic (not at [facility])  Result Value Ref Range   Color, Urine YELLOW YELLOW   APPearance CLEAR CLEAR   Specific Gravity, Urine 1.017 1.005 - 1.030   pH 6.0 5.0 - 8.0   Glucose, UA NEGATIVE NEGATIVE mg/dL   Hgb urine dipstick NEGATIVE NEGATIVE   Bilirubin Urine NEGATIVE NEGATIVE   Ketones, ur 15 (A) NEGATIVE mg/dL   Protein, ur NEGATIVE NEGATIVE mg/dL   Urobilinogen, UA 0.2 0.0 - 1.0 mg/dL   Nitrite NEGATIVE NEGATIVE   Leukocytes, UA NEGATIVE NEGATIVE  Lactic acid, plasma  Result Value Ref Range   Lactic Acid, Venous 0.8 0.5 - 2.0 mmol/L   Ct Abdomen Pelvis W Contrast  06/20/2015  CLINICAL DATA:  59 year old male with mid and lower abdominal pain for 2 days. Initial encounter. Current history of  hepatitis-C. EXAM: CT ABDOMEN AND PELVIS WITH CONTRAST TECHNIQUE: Multidetector CT imaging of the abdomen and pelvis was performed using the standard protocol following bolus administration of intravenous contrast. CONTRAST:  OMNIPAQUE IOHEXOL 300 MG/ML  SOLN COMPARISON:  CT Abdomen and Pelvis 10/07/2010. FINDINGS: Dependent atelectasis at both lung bases. No pericardial or pleural effusion. Degenerative changes in the spine. No acute osseous abnormality identified. No pelvic free fluid.  Negative urinary bladder and distal colon. Redundant sigmoid with retained stool. Moderate diverticulosis throughout the left colon, no active inflammation. Retained stool in the transverse colon. The cecum is on a lax mesentery, located in the right upper quadrant. Appendix not identified. No pericecal inflammation. No dilated small bowel. Decompressed stomach. Moderate inflammation surrounding the pancreas in the lesser sac with secondary involvement of the duodenum and porta hepatis. The pancreatic head and uncinate process appear most inflamed, with preserved enhancement. The pancreatic tail appears relatively normal. Inflammatory stranding continues to the root of the small bowel mesenteric. Portal venous system remains patent. Major arterial structures are patent. Aortoiliac calcified atherosclerosis noted. Small peripancreatic lymph nodes. Small duodenum diverticulum. Trace perihepatic free fluid (series 21, image 17). Stable, negative liver otherwise. Secondary inflammation at the gallbladder neck, but the gallbladder otherwise appears stable and within normal limits. Spleen and adrenal glands within normal limits. Bilateral renal enhancement and contrast excretion within normal limits. No fluid collection. No pneumoperitoneum. IMPRESSION: 1. Acute pancreatitis with moderate inflammation secondarily involving the duodenum and porta hepatis. Trace free fluid. No fluid collection or other complicating features  identified. 2. Otherwise stable abdomen and pelvis. 3. Mild pulmonary atelectasis. Electronically Signed   By: Odessa Fleming M.D.   On: 06/20/2015 23:00     Imaging Review No results found. I have personally reviewed and evaluated these images and lab results  as part of my medical decision-making.   EKG Interpretation None      MDM   Final diagnoses:  None    1. Pancreatitis  Lab and CT evidence of acute pancreatitis. Tachycardic to over 130 on initial exam, improving with fluids. No fever. History of hepatitis C, diabetes. Will admit for hydration and pain control. NPO status initiated.     Elpidio AnisShari Fiona Coto, PA-C 06/20/15 2315  Eber HongBrian Miller, MD 06/21/15 [phone]

## 2015-06-21 ENCOUNTER — Inpatient Hospital Stay (HOSPITAL_COMMUNITY): Payer: Medicaid Other

## 2015-06-21 ENCOUNTER — Encounter (HOSPITAL_COMMUNITY): Payer: Self-pay | Admitting: Family Medicine

## 2015-06-21 DIAGNOSIS — I10 Essential (primary) hypertension: Secondary | ICD-10-CM | POA: Diagnosis present

## 2015-06-21 DIAGNOSIS — K859 Acute pancreatitis without necrosis or infection, unspecified: Secondary | ICD-10-CM | POA: Diagnosis present

## 2015-06-21 DIAGNOSIS — E1149 Type 2 diabetes mellitus with other diabetic neurological complication: Secondary | ICD-10-CM | POA: Diagnosis present

## 2015-06-21 HISTORY — DX: Acute pancreatitis without necrosis or infection, unspecified: K85.90

## 2015-06-21 LAB — COMPREHENSIVE METABOLIC PANEL
ALT: 19 U/L (ref 17–63)
AST: 16 U/L (ref 15–41)
Albumin: 3.5 g/dL (ref 3.5–5.0)
Alkaline Phosphatase: 43 U/L (ref 38–126)
Anion gap: 8 (ref 5–15)
BILIRUBIN TOTAL: 0.6 mg/dL (ref 0.3–1.2)
BUN: 12 mg/dL (ref 6–20)
CO2: 28 mmol/L (ref 22–32)
Calcium: 8.9 mg/dL (ref 8.9–10.3)
Chloride: 102 mmol/L (ref 101–111)
Creatinine, Ser: 0.86 mg/dL (ref 0.61–1.24)
Glucose, Bld: 123 mg/dL — ABNORMAL HIGH (ref 65–99)
POTASSIUM: 4.3 mmol/L (ref 3.5–5.1)
Sodium: 138 mmol/L (ref 135–145)
Total Protein: 6.9 g/dL (ref 6.5–8.1)

## 2015-06-21 LAB — CBC
HEMATOCRIT: 39.6 % (ref 39.0–52.0)
Hemoglobin: 14.1 g/dL (ref 13.0–17.0)
MCH: 31.1 pg (ref 26.0–34.0)
MCHC: 35.6 g/dL (ref 30.0–36.0)
MCV: 87.2 fL (ref 78.0–100.0)
Platelets: 182 10*3/uL (ref 150–400)
RBC: 4.54 MIL/uL (ref 4.22–5.81)
RDW: 12.2 % (ref 11.5–15.5)
WBC: 7.1 10*3/uL (ref 4.0–10.5)

## 2015-06-21 LAB — GLUCOSE, CAPILLARY
GLUCOSE-CAPILLARY: 119 mg/dL — AB (ref 65–99)
Glucose-Capillary: 127 mg/dL — ABNORMAL HIGH (ref 65–99)
Glucose-Capillary: 93 mg/dL (ref 65–99)
Glucose-Capillary: 92 mg/dL (ref 65–99)
Glucose-Capillary: 91 mg/dL (ref 65–99)

## 2015-06-21 LAB — LIPID PANEL
Cholesterol: 142 mg/dL (ref 0–200)
HDL: 31 mg/dL — AB (ref >40)
LDL CALC: 82 mg/dL (ref 0–99)
Total CHOL/HDL Ratio: 4.6 RATIO
Triglycerides: 146 mg/dL (ref <150)
VLDL: 29 mg/dL (ref 0–40)

## 2015-06-21 LAB — LIPASE, BLOOD: Lipase: 141 U/L — ABNORMAL HIGH (ref 22–51)

## 2015-06-21 LAB — MRSA PCR SCREENING: MRSA by PCR: NEGATIVE

## 2015-06-21 MED ORDER — SIMVASTATIN 10 MG PO TABS
5.0000 mg | ORAL_TABLET | Freq: Every day | ORAL | Status: DC
Start: 2015-06-21 — End: 2015-06-25
  Administered 2015-06-21 – 2015-06-24 (×4): 5 mg via ORAL
  Filled 2015-06-21 (×4): qty 1

## 2015-06-21 MED ORDER — GLIPIZIDE 5 MG PO TABS: 2.5000 mg | ORAL_TABLET | Freq: Two times a day (BID) | ORAL | Status: DC

## 2015-06-21 MED ORDER — ONDANSETRON HCL 4 MG PO TABS: 4.0000 mg | ORAL_TABLET | Freq: Four times a day (QID) | ORAL | Status: DC | PRN

## 2015-06-21 MED ORDER — HYDROMORPHONE HCL 1 MG/ML IJ SOLN
1.0000 mg | INTRAMUSCULAR | Status: DC | PRN
Start: 2015-06-21 — End: 2015-06-22
  Administered 2015-06-21 – 2015-06-22 (×7): 1 mg via INTRAVENOUS
  Filled 2015-06-21 (×7): qty 1

## 2015-06-21 MED ORDER — ACETAMINOPHEN 325 MG PO TABS: 650.0000 mg | ORAL_TABLET | Freq: Four times a day (QID) | ORAL | Status: DC | PRN

## 2015-06-21 MED ORDER — DOCUSATE SODIUM 100 MG PO CAPS
100.0000 mg | ORAL_CAPSULE | Freq: Two times a day (BID) | ORAL | Status: DC
  Administered 2015-06-21 – 2015-06-25 (×9): 100 mg via ORAL
  Filled 2015-06-21 (×9): qty 1

## 2015-06-21 MED ORDER — SODIUM CHLORIDE 0.9 % IV SOLN
INTRAVENOUS | Status: DC
  Administered 2015-06-21 – 2015-06-22 (×3): via INTRAVENOUS

## 2015-06-21 MED ORDER — INSULIN ASPART 100 UNIT/ML ~~LOC~~ SOLN: 0.0000 [IU] | SUBCUTANEOUS | Status: DC

## 2015-06-21 MED ORDER — SODIUM CHLORIDE 0.9 % IV SOLN
INTRAVENOUS | Status: DC
  Administered 2015-06-21: 01:00:00 via INTRAVENOUS

## 2015-06-21 MED ORDER — INSULIN ASPART 100 UNIT/ML ~~LOC~~ SOLN
0.0000 [IU] | Freq: Three times a day (TID) | SUBCUTANEOUS | Status: DC
  Administered 2015-06-21: 1 [IU] via SUBCUTANEOUS

## 2015-06-21 MED ORDER — TAMSULOSIN HCL 0.4 MG PO CAPS
0.8000 mg | ORAL_CAPSULE | Freq: Every day | ORAL | Status: DC
  Administered 2015-06-21 – 2015-06-25 (×5): 0.8 mg via ORAL
  Filled 2015-06-21 (×5): qty 2

## 2015-06-21 MED ORDER — ENOXAPARIN SODIUM 40 MG/0.4ML ~~LOC~~ SOLN
40.0000 mg | SUBCUTANEOUS | Status: DC
  Administered 2015-06-21 – 2015-06-25 (×5): 40 mg via SUBCUTANEOUS
  Filled 2015-06-21 (×5): qty 0.4

## 2015-06-21 MED ORDER — METFORMIN HCL 500 MG PO TABS
1000.0000 mg | ORAL_TABLET | Freq: Two times a day (BID) | ORAL | Status: DC
  Administered 2015-06-21: 1000 mg via ORAL
  Filled 2015-06-21: qty 2

## 2015-06-21 MED ORDER — ONDANSETRON HCL 4 MG/2ML IJ SOLN: 4.0000 mg | Freq: Four times a day (QID) | INTRAMUSCULAR | Status: DC | PRN

## 2015-06-21 MED ORDER — ACETAMINOPHEN 650 MG RE SUPP: 650.0000 mg | Freq: Four times a day (QID) | RECTAL | Status: DC | PRN

## 2015-06-21 MED ORDER — LISINOPRIL 5 MG PO TABS
5.0000 mg | ORAL_TABLET | Freq: Every day | ORAL | Status: DC
  Administered 2015-06-21 – 2015-06-25 (×5): 5 mg via ORAL
  Filled 2015-06-21 (×5): qty 1

## 2015-06-21 MED ORDER — OXYCODONE HCL 5 MG PO TABS
5.0000 mg | ORAL_TABLET | ORAL | Status: DC | PRN
  Administered 2015-06-21 – 2015-06-22 (×7): 5 mg via ORAL
  Filled 2015-06-21 (×7): qty 1

## 2015-06-21 MED ORDER — INSULIN ASPART 100 UNIT/ML ~~LOC~~ SOLN: 0.0000 [IU] | Freq: Every day | SUBCUTANEOUS | Status: DC

## 2015-06-21 NOTE — H&P (Signed)
History and Physical  Frederick Morales  ZOX:[id number]  DOB: Apr 01, 1956  DOA: 06/20/2015  Referring physician: Elpidio Anis, PA-C PCP: No PCP Per Patient   Chief Complaint: Abdominal pain  HPI: Frederick Morales is a 59 y.o. male with a past medical history significant for Hep C recently cleared on Harvoni June 2016, NIDDM, chronic pain, and HTN who presents with several days abdominal pain.  The patient notes gradual onset of epigastric pain over the last several days.  He initially thought this was from too many sit-ups, but today the pain was so severe he couldn't stand. He had decreased appetite, and when he did try to eat something the pain became much more severe. He's had no jaundice, vomiting, diarrhea, or fever and chills. He denies recent alcohol use, or illicit drug use. He has not started any new medications in the last month.  In the ED, patient was tachycardic to 131, hypertensive, has highly elevated white count, and mild hyponatremia. The transaminases and bilirubin were normal. The lactic acid level was normal. The lipase was elevated and a CT of the abdomen and pelvis showed pancreatic stranding.  He was admitted to [facility] for pancreatitis.   Review of Systems:  Patient seen 12:35 AM on 06/21/2015. All other systems negative except as just noted or noted in the history of present illness.  Past Medical History  Diagnosis Date  . Arthritis   . Diabetes mellitus   . MRSA (methicillin resistant Staphylococcus aureus)   . Blood transfusion   . Hypertension   . Hepatitis C     Hep C, Harvoni and cleared in Jun 2016  The above past medical history was reviewed.  Past Surgical History  Procedure Laterality Date  . Appendectomy    The above surgical history was reviewed.  Social History: Patient lives with his mother in [place]. He is a part-time Surveyor, minerals. He quit smoking and quit drinking 30 years ago. He is mostly disabled from arthritis.     Allergies    Allergen Reactions  . Penicillins Swelling    Facial swelling Has patient had a PCN reaction causing immediate rash, facial/tongue/throat swelling, SOB or lightheadedness with hypotension: No Haspatient had a PCN reaction causing severe rash involving mucus membranes or skin necrosis/No Has patient had a PCN reaction that required hospitalization /No Has patient had a PCN reaction occurring within the last 10 years: No If all of the above answers are "NO", then may proceed with Cephalosporin use.     Family History  Problem Relation Age of Onset  . Diabetes Sister   . Tuberculosis Maternal Grandmother     Prior to Admission medications   Medication Sig Start Date End Date Taking? Authorizing Provider  gabapentin (NEURONTIN) 300 MG capsule Take 300 mg by mouth 3 (three) times daily.   Yes Historical Provider, MD  glipiZIDE (GLUCOTROL) 5 MG tablet Take 2.5 mg by mouth 2 (two) times daily before a meal.    Yes Historical Provider, MD  lisinopril (PRINIVIL,ZESTRIL) 5 MG tablet Take 5 mg by mouth daily.   Yes Historical Provider, MD  metFORMIN (GLUCOPHAGE) 500 MG tablet Take 1,000 mg by mouth 2 (two) times daily with a meal.    Yes Historical Provider, MD  simvastatin (ZOCOR) 5 MG tablet Take 5 mg by mouth at bedtime.   Yes Historical Provider, MD  tamsulosin (FLOMAX) 0.4 MG CAPS capsule Take 0.8 mg by mouth daily.    Yes Historical Provider, MD  traMADol Janean Sark) 50  MG tablet Take 1 tablet (50 mg total) by mouth every 6 (six) hours as needed. Patient taking differently: Take 50 mg by mouth every 6 (six) hours as needed for moderate pain.  05/23/14  Yes Jaynie Crumble, PA-C    Physical Exam: BP 125/89 mmHg  Pulse 101  Temp(Src) 98.1 F (36.7 C) (Oral)  Resp 22  SpO2 95% General appearance: Well-developed, adult male, alert and in moderate distress from pain with movement.  Responds appropriately to questions.   Eyes: Sclerae normal without icterus, conjunctiva pink, lids and  lashes normal.  PERRL and EOMI.   Nose: No deformity, discharge, or epistaxis.   Mouth: OP very dry without erythema, exudates, cobblestoning, or ulcers.  No airway deformities.   Skin: Warm and dry.  No jaundice.  No suspicious rashes or lesions. Cardiac: Mild tachycardia, regular, nl S1-S2, no murmurs appreciated.  Capillary refill is less than 2 seconds.  No LE edema. Respiratory: Normal respiratory rate and rhythm.  Crackles at both bases. Abdomen: BS present.  Abdomen with voluntary guarding.  Tenderness to palpation in epigastrium and RUQ.   No ascites, distension.   Neuro: Sensorium intact.  Speech is fluent.  Attention and concentration are normal.  Memory seems intact.  Moves all extremities equally and with normal coordination.    Psych: Behavior appears normal.       Labs on Admission:  The metabolic panel is notable for mild hyponatremia, normal potassium, normal renal function. The glucose level is elevated at 218. The complete blood count is notable for mild WBC elevation to 11.6K/uL. No anemia or thrombocytopenia. Lactate is normal. The lipase is 550 The urinalysis is unremarkable. The transaminases and bilirubin are normal.   Radiological Exams on Admission: Ct Abdomen Pelvis W Contrast 06/20/2015 IMPRESSION: 1. Acute pancreatitis with moderate inflammation secondarily involving the duodenum and porta hepatis. Trace free fluid. No fluid collection or other complicating features identified. 2. Otherwise stable abdomen and pelvis. 3. Mild pulmonary atelectasis.      EKG: Independently reviewed. Sinus tachycardia with RBBB.    Assessment/Plan  1. Acute pancreatitis:  This is new. This is a first episode. The patient denies alcohol use. The CT shows no ductal dilatation, particularly his right upper quadrant pain, I will rule out gallstones with right upper quadrant ultrasound. -RUQ ultrasound is ordered -Lipids/triglycerides as ordered -CMP repeat  tomorrow -Nothing by mouth -M IVF -Dilaudid for pain  2. Type 2 diabetes, well controlled:  stable. -Continue home metformin and glipizide -Sliding scale corrections   3. HTN:  this is slightly elevated due to pain. -Continue home lisinopril      DVT PPx: Lovenox Diet: NPO Code Status: Full Family Communication: the patient's diagnosis, expected course, and treatments were discussed with him and his mother at the bedside. All questions were answered.  Disposition Plan:  At the time of admission, it appears that the appropriate admission status for this patient is INPATIENT. This is judged to be reasonable and necessary in order to provide the required intensity of service to ensure the patient's safety given the presenting symptoms, physical exam findings, and initial radiographic and laboratory data in the context of their chronic comorbidities.  Together, these circumstances are felt to place her/him at high risk for further clinical deterioration threatening life, limb, or organ. The following factors support the admission status of inpatient:   A. The patient's presenting symptoms include abdominal pain B. The worrisome physical exam findings include epigastric tenderness, tachycardia C. The initial radiographic and laboratory  data are worrisome because of elevated lipase, leukocytosis, findings of pancreatittis on CT D. The chronic co-morbidities include DM and hypertension E. Patient requires inpatient status due to high intensity of service, high risk for further deterioration and high frequency of surveillance required. F. I certify that at the point of admission it is my clinical judgment that the patient will require inpatient hospital care spanning beyond 2 midnights from the point of admission.    Alberteen SamChristopher P Valeree Leidy [facility] Pager [phone]

## 2015-06-21 NOTE — Progress Notes (Signed)
Utilization review completed. Elmer Boutelle, RN, BSN. 

## 2015-06-21 NOTE — ED Notes (Signed)
Admitting MD at the bedside.  

## 2015-06-21 NOTE — Progress Notes (Signed)
PROGRESS NOTE    Su MonksRobert J Boonstra [id number]:[id number] DOB: [birth year] DOA: 06/20/2015 PCP: No PCP Per Patient  HPI/Brief narrative 59 y.o. male with a past medical history significant for Hep C recently cleared on Harvoni June 2016, NIDDM, chronic pain, and HTN who presents with several days abdominal pain-worse with eating, decreased appetite. He denies recent alcohol use, or illicit drug use. He has not started any new medications in the last month. In the ED, patient was tachycardic to 131, hypertensive, has highly elevated white count, and mild hyponatremia. The transaminases and bilirubin were normal. The lactic acid level was normal. The lipase was elevated and a CT of the abdomen and pelvis showed pancreatic stranding. [facility].   Assessment/Plan:  Acute pancreatitis - Unclear etiology. No history of alcohol use, no gallstones on abdominal ultrasound and lipid panel unremarkable. - Lipase was 550 on admission >reduced to 141 - CT abdomen confirms acute pancreatitis with moderate inflammation but no complicating features. - Treating supportively with bowel rest, IV fluids and pain medications.  - Consider outpatient GI follow-up for possible EUS  Type II DM, controlled - DC metformin and glipizide while inpatient. Treat with NovoLog SSI.  Essential hypertension - Controlled. Continue lisinopril  Hepatitis C - Recently cleared on Harvoni   DVT prophylaxis: Subcutaneous Lovenox Code Status: Full Family Communication: Discussed with patient's mother at bedside on 10/14 Disposition Plan: DC home when medically stable   Consultants:  None  Procedures:  None  Antibiotics:  None   Subjective: Abdominal pain unchanged. No nausea or vomiting. Rates abdominal pain as 6/10 and severity.  Objective: Filed Vitals:   06/21/15 0035 06/21/15 0045 06/21/15 0600 06/21/15 1257  BP: 114/72 138/81 115/66 134/90  Pulse: 95 94 92 90  Temp: 97.5 F  (36.4 C) 98.5 F (36.9 C) 98.7 F (37.1 C) 97.7 F (36.5 C)  TempSrc: Oral Oral Oral Oral  Resp: 18 19 18 18   Height:  5\' 9"  (1.753 m)    Weight:  84.4 kg (186 lb 1.1 oz)    SpO2: 97% 98% 99% 97%    Intake/Output Summary (Last 24 hours) at 06/21/15 1358 Last data filed at 06/21/15 0700  Gross per 24 hour  Intake  827.5 ml  Output    600 ml  Net  227.5 ml   Filed Weights   06/21/15 0045  Weight: 84.4 kg (186 lb 1.1 oz)     Exam:  General exam: Pleasant middle-aged male lying comfortably supine in bed. Respiratory system: Clear. No increased work of breathing. Cardiovascular system: S1 & S2 heard, RRR. No JVD, murmurs, gallops, clicks or pedal edema. Gastrointestinal system: Abdomen is nondistended, soft. Mild tenderness in the epigastrium without peritoneal signs. Normal bowel sounds heard. Central nervous system: Alert and oriented. No focal neurological deficits. Extremities: Symmetric 5 x 5 power.   Data Reviewed: Basic Metabolic Panel:  Recent Labs Lab 06/20/15 2038 06/21/15 0413  NA 132* 138  K 4.2 4.3  CL 98* 102  CO2 23 28  GLUCOSE 218* 123*  BUN 16 12  CREATININE 0.86 0.86  CALCIUM 9.7 8.9   Liver Function Tests:  Recent Labs Lab 06/20/15 2038 06/21/15 0413  AST 21 16  ALT 21 19  ALKPHOS 52 43  BILITOT 0.9 0.6  PROT 7.8 6.9  ALBUMIN 4.1 3.5    Recent Labs Lab 06/20/15 2038 06/21/15 0835  LIPASE 550* 141*   No results for input(s): AMMONIA in the last 168 hours.  CBC:  Recent Labs Lab 06/20/15 2038 06/21/15 0413  WBC 11.6* 7.1  NEUTROABS 9.4*  --   HGB 16.1 14.1  HCT 45.0 39.6  MCV 86.9 87.2  PLT 228 182   Cardiac Enzymes: No results for input(s): CKTOTAL, CKMB, CKMBINDEX, TROPONINI in the last 168 hours. BNP (last 3 results) No results for input(s): PROBNP in the last 8760 hours. CBG:  Recent Labs Lab 06/21/15 0108 06/21/15 0627 06/21/15 1143  GLUCAP 119* 127* 93    Recent Results (from the past 240 hour(s))    MRSA PCR Screening     Status: None   Collection Time: 06/21/15  2:03 AM  Result Value Ref Range Status   MRSA by PCR NEGATIVE NEGATIVE Final    Comment:        The GeneXpert MRSA Assay (FDA approved for NASAL specimens only), is one component of a comprehensive MRSA colonization surveillance program. It is not intended to diagnose MRSA infection nor to guide or monitor treatment for MRSA infections.          Studies: US Abdomen Complete  06/21/2015  CLINICAL DATA:  Acute pancreatitis.  Abdominal complaints. EXAM: ULTRASOUND ABDOMEN COMPLETE COMPARISON:  CT abdomen and pelvis 06/20/2015 FINDINGS: Gallbladder: No gallstones or wall thickening visualized. No sonographic Murphy sign noted. Common bile duct: Diameter: 4.5 mm, normal Liver: No focal lesion identified. Within normal limits in parenchymal echogenicity. IVC: No abnormality visualized. Pancreas: Not visualized due to overlying bowel gas. Spleen: Size and appearance within normal limits. Right Kidney: Length: 12.1 cm. Echogenicity within normal limits. No mass or hydronephrosis visualized. Left Kidney: Length: 12.9 cm. Echogenicity within normal limits. No mass or hydronephrosis visualized. Abdominal aorta: Not visualized due to overlying bowel gas. Other findings: None. IMPRESSION: No acute abnormalities demonstrated. Pancreas not visualized due to overlying bowel gas. Electronically Signed   By: Burman Nieves M.D.   On: 06/21/2015 02:12   Ct Abdomen Pelvis W Contrast  06/20/2015  CLINICAL DATA:  59 year old male with mid and lower abdominal pain for 2 days. Initial encounter. Current history of hepatitis-C. EXAM: CT ABDOMEN AND PELVIS WITH CONTRAST TECHNIQUE: Multidetector CT imaging of the abdomen and pelvis was performed using the standard protocol following bolus administration of intravenous contrast. CONTRAST:  OMNIPAQUE IOHEXOL 300 MG/ML  SOLN COMPARISON:  CT Abdomen and Pelvis 10/07/2010. FINDINGS: Dependent  atelectasis at both lung bases. No pericardial or pleural effusion. Degenerative changes in the spine. No acute osseous abnormality identified. No pelvic free fluid.  Negative urinary bladder and distal colon. Redundant sigmoid with retained stool. Moderate diverticulosis throughout the left colon, no active inflammation. Retained stool in the transverse colon. The cecum is on a lax mesentery, located in the right upper quadrant. Appendix not identified. No pericecal inflammation. No dilated small bowel. Decompressed stomach. Moderate inflammation surrounding the pancreas in the lesser sac with secondary involvement of the duodenum and porta hepatis. The pancreatic head and uncinate process appear most inflamed, with preserved enhancement. The pancreatic tail appears relatively normal. Inflammatory stranding continues to the root of the small bowel mesenteric. Portal venous system remains patent. Major arterial structures are patent. Aortoiliac calcified atherosclerosis noted. Small peripancreatic lymph nodes. Small duodenum diverticulum. Trace perihepatic free fluid (series 21, image 17). Stable, negative liver otherwise. Secondary inflammation at the gallbladder neck, but the gallbladder otherwise appears stable and within normal limits. Spleen and adrenal glands within normal limits. Bilateral renal enhancement and contrast excretion within normal limits. No fluid collection. No pneumoperitoneum. IMPRESSION: 1. Acute pancreatitis  with moderate inflammation secondarily involving the duodenum and porta hepatis. Trace free fluid. No fluid collection or other complicating features identified. 2. Otherwise stable abdomen and pelvis. 3. Mild pulmonary atelectasis. Electronically Signed   By: Odessa Fleming M.D.   On: 06/20/2015 23:00        Scheduled Meds: . docusate sodium  100 mg Oral BID  . enoxaparin (LOVENOX) injection  40 mg Subcutaneous Q24H  . glipiZIDE  2.5 mg Oral BID AC  . insulin aspart  0-5 Units  Subcutaneous QHS  . insulin aspart  0-9 Units Subcutaneous TID WC  . lisinopril  5 mg Oral Daily  . metFORMIN  1,000 mg Oral BID WC  . simvastatin  5 mg Oral QHS  . tamsulosin  0.8 mg Oral Daily   Continuous Infusions: . sodium chloride 150 mL/hr at 06/21/15 0129    Principal Problem:   Pancreatitis Active Problems:   DM (diabetes mellitus) type II controlled, neurological manifestation (HCC)   Essential hypertension   Acute pancreatitis    Time spent: 25 minutes    Soliyana Mcchristian, MD, FACP, FHM. [facility] Pager [phone]  If 7PM-7AM, please contact night-coverage [email or web address] Password [email or web address] 06/21/2015, 1:58 PM    LOS: 1 day

## 2015-06-22 DIAGNOSIS — E1165 Type 2 diabetes mellitus with hyperglycemia: Secondary | ICD-10-CM

## 2015-06-22 DIAGNOSIS — E118 Type 2 diabetes mellitus with unspecified complications: Secondary | ICD-10-CM

## 2015-06-22 LAB — GLUCOSE, CAPILLARY
GLUCOSE-CAPILLARY: 90 mg/dL (ref 65–99)
GLUCOSE-CAPILLARY: 69 mg/dL (ref 65–99)
GLUCOSE-CAPILLARY: 84 mg/dL (ref 65–99)
Glucose-Capillary: 101 mg/dL — ABNORMAL HIGH (ref 65–99)
Glucose-Capillary: 94 mg/dL (ref 65–99)
Glucose-Capillary: 94 mg/dL (ref 65–99)

## 2015-06-22 MED ORDER — HYDRALAZINE HCL 20 MG/ML IJ SOLN
5.0000 mg | Freq: Four times a day (QID) | INTRAMUSCULAR | Status: DC | PRN
  Administered 2015-06-22: 5 mg via INTRAVENOUS
  Filled 2015-06-22: qty 1

## 2015-06-22 MED ORDER — GABAPENTIN 300 MG PO CAPS
300.0000 mg | ORAL_CAPSULE | Freq: Three times a day (TID) | ORAL | Status: DC
  Administered 2015-06-22 – 2015-06-25 (×10): 300 mg via ORAL
  Filled 2015-06-22 (×10): qty 1

## 2015-06-22 MED ORDER — OXYCODONE HCL 5 MG PO TABS
10.0000 mg | ORAL_TABLET | ORAL | Status: DC | PRN
  Administered 2015-06-22 – 2015-06-25 (×11): 10 mg via ORAL
  Filled 2015-06-22 (×11): qty 2

## 2015-06-22 MED ORDER — SODIUM CHLORIDE 0.9 % IV SOLN
INTRAVENOUS | Status: AC
  Administered 2015-06-22: 23:00:00 via INTRAVENOUS
  Administered 2015-06-22: 100 mL/h via INTRAVENOUS

## 2015-06-22 MED ORDER — HYDROMORPHONE HCL 1 MG/ML IJ SOLN
2.0000 mg | INTRAMUSCULAR | Status: DC | PRN
  Administered 2015-06-22 – 2015-06-24 (×12): 2 mg via INTRAVENOUS
  Filled 2015-06-22 (×12): qty 2

## 2015-06-22 NOTE — Progress Notes (Signed)
PROGRESS NOTE    Frederick MonksRobert J Lizer AVW:[id number] DOB: [birth year] DOA: 06/20/2015 PCP: No PCP Per Patient  HPI/Brief narrative 59 y.o. male with a past medical history significant for Hep C recently cleared on Harvoni June 2016, NIDDM, chronic pain, and HTN who presents with several days abdominal pain-worse with eating, decreased appetite. He denies recent alcohol use, or illicit drug use. He has not started any new medications in the last month. In the ED, patient was tachycardic to 131, hypertensive, has highly elevated white count, and mild hyponatremia. The transaminases and bilirubin were normal. The lactic acid level was normal. The lipase was elevated and a CT of the abdomen and pelvis showed pancreatic stranding. [facility]   Assessment/Plan:  Acute pancreatitis - Unclear etiology. No history of alcohol use, no gallstones on abdominal ultrasound and lipid panel unremarkable. - Lipase was 550 on admission >reduced to 141 - CT abdomen confirms acute pancreatitis with moderate inflammation but no complicating features. - Treating supportively with bowel rest, IV fluids and pain medications.  - Consider outpatient GI follow-up for possible EUS - Patient states that abdominal pain is either about the same or even slightly worse than yesterday. Chest pain medications. Incentive spirometry. If he continues to have worsening pain or develops other complicating factors i.e. fevers etc., may consider repeating CT abdomen.  Type II DM, controlled - DC metformin and glipizide while inpatient. Treat with NovoLog SSI.  Essential hypertension - Continue lisinopril. Uncontrolled-likely precipitated by pain. We will add when necessary IV hydralazine.  Hepatitis C - Recently cleared on Harvoni   DVT prophylaxis: Subcutaneous Lovenox Code Status: Full Family Communication: Discussed with patient's mother at bedside on 10/15 Disposition Plan: DC home when  medically stable   Consultants:  None  Procedures:  None  Antibiotics:  None   Subjective: States that abdominal pain is either unchanged or even slightly worse than yesterday. Rates it at 7/10, upper abdomen, radiating to right flank, no nausea or vomiting, minimal flatus and BM prior to admission.  Objective: Filed Vitals:   06/21/15 1257 06/21/15 1943 06/22/15 0158 06/22/15 0524  BP: 134/90 163/101 181/101 180/110  Pulse: 90 89 83 85  Temp: 97.7 F (36.5 C) 97.7 F (36.5 C) 97.8 F (36.6 C) 97.7 F (36.5 C)  TempSrc: Oral Oral    Resp: 18 18 18 18   Height:      Weight:      SpO2: 97% 99% 99% 99%    Intake/Output Summary (Last 24 hours) at 06/22/15 0838 Last data filed at 06/22/15 0813  Gross per 24 hour  Intake   1650 ml  Output   2400 ml  Net   -750 ml   Filed Weights   06/21/15 0045  Weight: 84.4 kg (186 lb 1.1 oz)     Exam:  General exam: Pleasant middle-aged male lying comfortably supine in bed. Respiratory system: Clear. No increased work of breathing. Cardiovascular system: S1 & S2 heard, RRR. No JVD, murmurs, gallops, clicks or pedal edema. Gastrointestinal system: Abdomen is nondistended, soft. Mild tenderness in the epigastrium without peritoneal signs. Normal bowel sounds heard. Central nervous system: Alert and oriented. No focal neurological deficits. Extremities: Symmetric 5 x 5 power.   Data Reviewed: Basic Metabolic Panel:  Recent Labs Lab 06/20/15 2038 06/21/15 0413  NA 132* 138  K 4.2 4.3  CL 98* 102  CO2 23 28  GLUCOSE 218* 123*  BUN 16 12  CREATININE 0.86 0.86  CALCIUM  9.7 8.9   Liver Function Tests:  Recent Labs Lab 06/20/15 2038 06/21/15 0413  AST 21 16  ALT 21 19  ALKPHOS 52 43  BILITOT 0.9 0.6  PROT 7.8 6.9  ALBUMIN 4.1 3.5    Recent Labs Lab 06/20/15 2038 06/21/15 0835  LIPASE 550* 141*   No results for input(s): AMMONIA in the last 168 hours. CBC:  Recent Labs Lab 06/20/15 2038  06/21/15 0413  WBC 11.6* 7.1  NEUTROABS 9.4*  --   HGB 16.1 14.1  HCT 45.0 39.6  MCV 86.9 87.2  PLT 228 182   Cardiac Enzymes: No results for input(s): CKTOTAL, CKMB, CKMBINDEX, TROPONINI in the last 168 hours. BNP (last 3 results) No results for input(s): PROBNP in the last 8760 hours. CBG:  Recent Labs Lab 06/21/15 1143 06/21/15 1634 06/21/15 2011 06/22/15 0013 06/22/15 0515  GLUCAP 93 92 91 94 101*    Recent Results (from the past 240 hour(s))  MRSA PCR Screening     Status: None   Collection Time: 06/21/15  2:03 AM  Result Value Ref Range Status   MRSA by PCR NEGATIVE NEGATIVE Final    Comment:        The GeneXpert MRSA Assay (FDA approved for NASAL specimens only), is one component of a comprehensive MRSA colonization surveillance program. It is not intended to diagnose MRSA infection nor to guide or monitor treatment for MRSA infections.          Studies: US Abdomen Complete  06/21/2015  CLINICAL DATA:  Acute pancreatitis.  Abdominal complaints. EXAM: ULTRASOUND ABDOMEN COMPLETE COMPARISON:  CT abdomen and pelvis 06/20/2015 FINDINGS: Gallbladder: No gallstones or wall thickening visualized. No sonographic Murphy sign noted. Common bile duct: Diameter: 4.5 mm, normal Liver: No focal lesion identified. Within normal limits in parenchymal echogenicity. IVC: No abnormality visualized. Pancreas: Not visualized due to overlying bowel gas. Spleen: Size and appearance within normal limits. Right Kidney: Length: 12.1 cm. Echogenicity within normal limits. No mass or hydronephrosis visualized. Left Kidney: Length: 12.9 cm. Echogenicity within normal limits. No mass or hydronephrosis visualized. Abdominal aorta: Not visualized due to overlying bowel gas. Other findings: None. IMPRESSION: No acute abnormalities demonstrated. Pancreas not visualized due to overlying bowel gas. Electronically Signed   By: Burman Nieves M.D.   On: 06/21/2015 02:12   Ct Abdomen Pelvis W  Contrast  06/20/2015  CLINICAL DATA:  59 year old male with mid and lower abdominal pain for 2 days. Initial encounter. Current history of hepatitis-C. EXAM: CT ABDOMEN AND PELVIS WITH CONTRAST TECHNIQUE: Multidetector CT imaging of the abdomen and pelvis was performed using the standard protocol following bolus administration of intravenous contrast. CONTRAST:  OMNIPAQUE IOHEXOL 300 MG/ML  SOLN COMPARISON:  CT Abdomen and Pelvis 10/07/2010. FINDINGS: Dependent atelectasis at both lung bases. No pericardial or pleural effusion. Degenerative changes in the spine. No acute osseous abnormality identified. No pelvic free fluid.  Negative urinary bladder and distal colon. Redundant sigmoid with retained stool. Moderate diverticulosis throughout the left colon, no active inflammation. Retained stool in the transverse colon. The cecum is on a lax mesentery, located in the right upper quadrant. Appendix not identified. No pericecal inflammation. No dilated small bowel. Decompressed stomach. Moderate inflammation surrounding the pancreas in the lesser sac with secondary involvement of the duodenum and porta hepatis. The pancreatic head and uncinate process appear most inflamed, with preserved enhancement. The pancreatic tail appears relatively normal. Inflammatory stranding continues to the root of the small bowel mesenteric. Portal venous system remains  patent. Major arterial structures are patent. Aortoiliac calcified atherosclerosis noted. Small peripancreatic lymph nodes. Small duodenum diverticulum. Trace perihepatic free fluid (series 21, image 17). Stable, negative liver otherwise. Secondary inflammation at the gallbladder neck, but the gallbladder otherwise appears stable and within normal limits. Spleen and adrenal glands within normal limits. Bilateral renal enhancement and contrast excretion within normal limits. No fluid collection. No pneumoperitoneum. IMPRESSION: 1. Acute pancreatitis with moderate  inflammation secondarily involving the duodenum and porta hepatis. Trace free fluid. No fluid collection or other complicating features identified. 2. Otherwise stable abdomen and pelvis. 3. Mild pulmonary atelectasis. Electronically Signed   By: Odessa Fleming M.D.   On: 06/20/2015 23:00        Scheduled Meds: . docusate sodium  100 mg Oral BID  . enoxaparin (LOVENOX) injection  40 mg Subcutaneous Q24H  . gabapentin  300 mg Oral TID  . insulin aspart  0-9 Units Subcutaneous 6 times per day  . lisinopril  5 mg Oral Daily  . simvastatin  5 mg Oral QHS  . tamsulosin  0.8 mg Oral Daily   Continuous Infusions: . sodium chloride      Principal Problem:   Pancreatitis Active Problems:   DM (diabetes mellitus) type II controlled, neurological manifestation (HCC)   Essential hypertension   Acute pancreatitis    Time spent: 25 minutes    Thomas Mabry, MD, FACP, FHM. [facility] Pager [phone]  If 7PM-7AM, please contact night-coverage [email or web address] Password [email or web address] 06/22/2015, 8:38 AM    LOS: 2 days

## 2015-06-23 LAB — CBC
HEMATOCRIT: 39.1 % (ref 39.0–52.0)
HEMOGLOBIN: 13.4 g/dL (ref 13.0–17.0)
MCH: 29.7 pg (ref 26.0–34.0)
MCHC: 34.3 g/dL (ref 30.0–36.0)
MCV: 86.7 fL (ref 78.0–100.0)
PLATELETS: 195 10*3/uL (ref 150–400)
RBC: 4.51 MIL/uL (ref 4.22–5.81)
RDW: 12.1 % (ref 11.5–15.5)
WBC: 4.1 10*3/uL (ref 4.0–10.5)

## 2015-06-23 LAB — COMPREHENSIVE METABOLIC PANEL
ALBUMIN: 3.5 g/dL (ref 3.5–5.0)
ALK PHOS: 47 U/L (ref 38–126)
ALT: 16 U/L — ABNORMAL LOW (ref 17–63)
ANION GAP: 9 (ref 5–15)
AST: 17 U/L (ref 15–41)
BILIRUBIN TOTAL: 0.9 mg/dL (ref 0.3–1.2)
BUN: 10 mg/dL (ref 6–20)
CHLORIDE: 96 mmol/L — AB (ref 101–111)
CO2: 27 mmol/L (ref 22–32)
Calcium: 8.8 mg/dL — ABNORMAL LOW (ref 8.9–10.3)
Creatinine, Ser: 0.83 mg/dL (ref 0.61–1.24)
GFR calc non Af Amer: >60 mL/min (ref >60)
Glucose, Bld: 113 mg/dL — ABNORMAL HIGH (ref 65–99)
POTASSIUM: 3.6 mmol/L (ref 3.5–5.1)
Sodium: 132 mmol/L — ABNORMAL LOW (ref 135–145)
Total Protein: 6.9 g/dL (ref 6.5–8.1)

## 2015-06-23 LAB — GLUCOSE, CAPILLARY
GLUCOSE-CAPILLARY: 95 mg/dL (ref 65–99)
GLUCOSE-CAPILLARY: 82 mg/dL (ref 65–99)
GLUCOSE-CAPILLARY: 110 mg/dL — AB (ref 65–99)
Glucose-Capillary: 114 mg/dL — ABNORMAL HIGH (ref 65–99)
Glucose-Capillary: 130 mg/dL — ABNORMAL HIGH (ref 65–99)
Glucose-Capillary: 172 mg/dL — ABNORMAL HIGH (ref 65–99)

## 2015-06-23 LAB — LIPASE, BLOOD: Lipase: 35 U/L (ref 22–51)

## 2015-06-23 MED ORDER — INSULIN ASPART 100 UNIT/ML ~~LOC~~ SOLN
0.0000 [IU] | Freq: Three times a day (TID) | SUBCUTANEOUS | Status: DC
  Administered 2015-06-23 – 2015-06-24 (×2): 2 [IU] via SUBCUTANEOUS
  Administered 2015-06-25: 1 [IU] via SUBCUTANEOUS

## 2015-06-23 MED ORDER — SODIUM CHLORIDE 0.9 % IV SOLN
INTRAVENOUS | Status: DC
  Administered 2015-06-23: 09:00:00 via INTRAVENOUS

## 2015-06-23 NOTE — Progress Notes (Signed)
PROGRESS NOTE    Frederick MonksRobert J Alfrey [id number] DOB: Sep 02, 1956 DOA: 06/20/2015 PCP: No PCP Per Patient  HPI/Brief narrative 59 y.o. male with a past medical history significant for Hep C recently cleared on Harvoni June 2016, NIDDM, chronic pain, and HTN who presents with several days abdominal pain-worse with eating, decreased appetite. He denies recent alcohol use, or illicit drug use. He has not started any new medications in the last month. In the ED, patient was tachycardic to 131, hypertensive, has highly elevated white count, and mild hyponatremia. The transaminases and bilirubin were normal. The lactic acid level was normal. The lipase was elevated and a CT of the abdomen and pelvis showed pancreatic stranding. [facility]   Assessment/Plan:  Acute pancreatitis - Unclear etiology. No history of alcohol use, no gallstones on abdominal ultrasound and lipid panel unremarkable. - Lipase was 550 on admission >reduced to 141 - CT abdomen confirms acute pancreatitis with moderate inflammation but no complicating features. - Treating supportively with bowel rest, IV fluids and pain medications.  - Consider outpatient GI follow-up for possible EUS - Overall feels better. Improved abdominal pain. Start clear liquids today and advance gradually as tolerated.  Type II DM, controlled - DC metformin and glipizide while inpatient. Treat with NovoLog SSI. Controlled. Transient hypoglycemia last evening-monitor with oral intake.  Essential hypertension - Continue lisinopril. Uncontrolled-likely precipitated by pain. We will add when necessary IV hydralazine. Better controlled.  Hepatitis C - Recently cleared on Harvoni   DVT prophylaxis: Subcutaneous Lovenox Code Status: Full Family Communication: None at bedside. Disposition Plan: DC home when medically stable. Possible DC home  10/17.   Consultants:  None  Procedures:  None  Antibiotics:  None   Subjective: Overall feels better. Decreased abdominal pain. Rates it at 5/10 and intermittent. Flatus +. No BM. No nausea or vomiting. Willing to try clear liquids.  Objective: Filed Vitals:   06/22/15 1355 06/22/15 2041 06/22/15 2359 06/23/15 0400  BP: 113/89 132/91 125/79 135/84  Pulse: 96 90 86 84  Temp: 98.4 F (36.9 C) 98.3 F (36.8 C) 98.1 F (36.7 C) 97.6 F (36.4 C)  TempSrc: Oral Oral Oral Oral  Resp:  18 18 18   Height:      Weight:      SpO2: 99% 98% 98% 96%    Intake/Output Summary (Last 24 hours) at 06/23/15 0847 Last data filed at 06/23/15 0818  Gross per 24 hour  Intake   2025 ml  Output    750 ml  Net   1275 ml   Filed Weights   06/21/15 0045  Weight: 84.4 kg (186 lb 1.1 oz)     Exam:  General exam: Pleasant middle-aged male lying comfortably supine in bed. Respiratory system: Clear. No increased work of breathing. Cardiovascular system: S1 & S2 heard, RRR. No JVD, murmurs, gallops, clicks or pedal edema. Gastrointestinal system: Abdomen is nondistended, soft. Mild tenderness in the epigastrium without peritoneal signs. Normal bowel sounds heard. Central nervous system: Alert and oriented. No focal neurological deficits. Extremities: Symmetric 5 x 5 power.   Data Reviewed: Basic Metabolic Panel:  Recent Labs Lab 06/20/15 2038 06/21/15 0413 06/23/15 0500  NA 132* 138 132*  K 4.2 4.3 3.6  CL 98* 102 96*  CO2 23 28 27   GLUCOSE 218* 123* 113*  BUN 16 12 10   CREATININE 0.86 0.86 0.83  CALCIUM 9.7 8.9 8.8*   Liver Function Tests:  Recent Labs Lab 06/20/15 2038 06/21/15 0413  06/23/15 0500  AST ALT 21 19 16*  ALKPHOS 52 43 47  BILITOT 0.9 0.6 0.9  PROT 7.8 6.9 6.9  ALBUMIN 4.1 3.5 3.5    Recent Labs Lab 06/20/15 2038 06/21/15 0835 06/23/15 0500  LIPASE 550* 141* 35   No results for input(s): AMMONIA in the last 168  hours. CBC:  Recent Labs Lab 06/20/15 2038 06/21/15 0413 06/23/15 0500  WBC 11.6* 7.1 4.1  NEUTROABS 9.4*  --   --   HGB 16.1 14.1 13.4  HCT 45.0 39.6 39.1  MCV 86.9 87.2 86.7  PLT 228 182 195   Cardiac Enzymes: No results for input(s): CKTOTAL, CKMB, CKMBINDEX, TROPONINI in the last 168 hours. BNP (last 3 results) No results for input(s): PROBNP in the last 8760 hours. CBG:  Recent Labs Lab 06/22/15 1729 06/22/15 2039 06/22/15 2358 06/23/15 0401 06/23/15 0825  GLUCAP 69 84 114* 95 82    Recent Results (from the past 240 hour(s))  MRSA PCR Screening     Status: None   Collection Time: 06/21/15  2:03 AM  Result Value Ref Range Status   MRSA by PCR NEGATIVE NEGATIVE Final    Comment:        The GeneXpert MRSA Assay (FDA approved for NASAL specimens only), is one component of a comprehensive MRSA colonization surveillance program. It is not intended to diagnose MRSA infection nor to guide or monitor treatment for MRSA infections.          Studies: No results found.      Scheduled Meds: . docusate sodium  100 mg Oral BID  . enoxaparin (LOVENOX) injection  40 mg Subcutaneous Q24H  . gabapentin  300 mg Oral TID  . insulin aspart  0-9 Units Subcutaneous 6 times per day  . lisinopril  5 mg Oral Daily  . simvastatin  5 mg Oral QHS  . tamsulosin  0.8 mg Oral Daily   Continuous Infusions: . sodium chloride      Principal Problem:   Pancreatitis Active Problems:   DM (diabetes mellitus) type II controlled, neurological manifestation (HCC)   Essential hypertension   Acute pancreatitis    Time spent: 25 minutes    Yazen Rosko, MD, FACP, FHM. [facility] Pager [phone]  If 7PM-7AM, please contact night-coverage [email or web address] Password [email or web address] 06/23/2015, 8:47 AM    LOS: 3 days

## 2015-06-24 DIAGNOSIS — E1142 Type 2 diabetes mellitus with diabetic polyneuropathy: Secondary | ICD-10-CM

## 2015-06-24 DIAGNOSIS — I10 Essential (primary) hypertension: Secondary | ICD-10-CM

## 2015-06-24 LAB — GLUCOSE, CAPILLARY
GLUCOSE-CAPILLARY: 176 mg/dL — AB (ref 65–99)
Glucose-Capillary: 87 mg/dL (ref 65–99)
Glucose-Capillary: 118 mg/dL — ABNORMAL HIGH (ref 65–99)
Glucose-Capillary: 76 mg/dL (ref 65–99)

## 2015-06-24 MED ORDER — GLUCERNA SHAKE PO LIQD: 237.0000 mL | Freq: Two times a day (BID) | ORAL | Status: DC

## 2015-06-24 MED ORDER — HYDROMORPHONE HCL 1 MG/ML IJ SOLN
1.0000 mg | INTRAMUSCULAR | Status: DC | PRN
  Administered 2015-06-24: 1 mg via INTRAVENOUS
  Filled 2015-06-24 (×2): qty 1

## 2015-06-24 NOTE — Progress Notes (Signed)
Patient tried to take only pain pills during the night, however he required dilaudid for pain of 5/10. States his pain is much better. Has been drinking clear liquids with no nausea or vomiting.

## 2015-06-24 NOTE — Progress Notes (Signed)
Initial Nutrition Assessment  DOCUMENTATION CODES:   Not applicable  INTERVENTION:   Provide Glucerna Shake po BID, each supplement provides 220 kcal and 10 grams of protein.  Encourage adequate PO intake.   Diet education given.   NUTRITION DIAGNOSIS:   Increased nutrient needs related to acute illness as evidenced by estimated needs.  GOAL:   Patient will meet greater than or equal to 90% of their needs  MONITOR:   PO intake, Supplement acceptance, Diet advancement, Weight trends, Labs, I & O's  REASON FOR ASSESSMENT:   Consult Diet education  ASSESSMENT:   59 y.o. male with a past medical history significant for Hep C recently cleared on Harvoni June 2016, NIDDM, chronic pain, and HTN who presents with several days abdominal pain-worse with eating, decreased appetite. He denies recent alcohol use, or illicit drug use. He has not started any new medications in the last month. In the ED, patient was tachycardic to 131, hypertensive, has highly elevated white count, and mild hyponatremia. The transaminases and bilirubin were normal. The lactic acid level was normal. The lipase was elevated and a CT of the abdomen and pelvis showed pancreatic stranding. [facility].  Pt reports having a good appetite currently and PTA with no other difficulties. Pt is on a full liquid diet with meal completion 50-100%. Weight has been stable. Pt is agreeable to nutritional supplements to aid in caloric and protein needs. RD to order. RD was initially consulted for a diet education. Education was given.   Pt with no observed significant fat or muscle mass loss.   Labs and medications reviewed.   Diet Order:  Diet full liquid Room service appropriate?: Yes; Fluid consistency:: Thin  Skin:  Reviewed, no issues  Last BM:  10/13  Height:   Ht Readings from Last 1 Encounters:  06/21/15 5\' 9"  (1.753 m)    Weight:   Wt Readings from Last 1 Encounters:   06/21/15 186 lb 1.1 oz (84.4 kg)    Ideal Body Weight:  72.7 kg  BMI:  Body mass index is 27.46 kg/(m^2).  Estimated Nutritional Needs:   Kcal:  2000-2200  Protein:  100-110 grams  Fluid:  2- 2.2 L/day  EDUCATION NEEDS:   Education needs addressed  Roslyn SmilingStephanie Baby Stairs, MS, RD, LDN Pager # [phone] After hours/ weekend pager # [phone]

## 2015-06-24 NOTE — Progress Notes (Signed)
PROGRESS NOTE    Frederick Morales XLK:[id number] DOB: 1956/04/14 DOA: 06/20/2015 PCP: No PCP Per Patient  HPI/Brief narrative 59 y.o. male with a past medical history significant for Hep C recently cleared on Harvoni June 2016, NIDDM, chronic pain, and HTN who presents with several days abdominal pain-worse with eating, decreased appetite. He denies recent alcohol use, or illicit drug use. He has not started any new medications in the last month. In the ED, patient was tachycardic to 131, hypertensive, has highly elevated white count, and mild hyponatremia. The transaminases and bilirubin were normal. The lactic acid level was normal. The lipase was elevated and a CT of the abdomen and pelvis showed pancreatic stranding. He was admitted to [facility] for pancreatitis.   Assessment/Plan:  Acute pancreatitis - Unclear etiology. No history of alcohol use, no gallstones on abdominal ultrasound and lipid panel unremarkable. - Lipase was 550 on admission >reduced to 141 - CT abdomen confirms acute pancreatitis with moderate inflammation but no complicating features. - Consider outpatient GI follow-up for possible EUS - Overall feels better. Improved abdominal pain. Advance to full liquids for lunch and then if goes well regular diet for dinner and possible d/c in AM  Type II DM, controlled - DC metformin and glipizide while inpatient. Treat with NovoLog SSI. Controlled. Transient hypoglycemia last evening-monitor with oral intake.  Essential hypertension - Continue lisinopril. Uncontrolled-likely precipitated by pain. We will add when necessary IV hydralazine. Better controlled.  Hepatitis C - Recently cleared on Harvoni   DVT prophylaxis: Subcutaneous Lovenox Code Status: Full Family Communication: None at bedside. Disposition Plan: DC home when medically stable suspect 10/18   Consultants:  None  Procedures:  None  Antibiotics:  None   Subjective: Tolerating clear  liquids Pain better +flatus  Objective: Filed Vitals:   06/23/15 1024 06/23/15 1433 06/23/15 2107 06/24/15 0647  BP: 122/86 116/71 111/71 115/79  Pulse: 64 72 72 69  Temp:  97.7 F (36.5 C) 98 F (36.7 C) 97.7 F (36.5 C)  TempSrc:  Oral Oral Oral  Resp: Height:      Weight:      SpO2: 99% 99% 100% 95%    Intake/Output Summary (Last 24 hours) at 06/24/15 0755 Last data filed at 06/24/15 0653  Gross per 24 hour  Intake   1360 ml  Output   1300 ml  Net     60 ml   Filed Weights   06/21/15 0045  Weight: 84.4 kg (186 lb 1.1 oz)     Exam:  General exam: NAD, A+Ox3 Respiratory system: Clear. No increased work of breathing. Cardiovascular system: S1 & S2 heard, RRR. No JVD, murmurs, gallops, clicks or pedal edema. Gastrointestinal system: Abdomen is nondistended, soft. Mild tenderness in the epigastrium without peritoneal signs. Normal bowel sounds heard. Central nervous system: Alert and oriented. No focal neurological deficits. Extremities: no edema   Data Reviewed: Basic Metabolic Panel:  Recent Labs Lab 06/20/15 2038 06/21/15 0413 06/23/15 0500  NA 132* 138 132*  K 4.2 4.3 3.6  CL 98* 102 96*  CO2 GLUCOSE 218* 123* 113*  BUN CREATININE 0.86 0.86 0.83  CALCIUM 9.7 8.9 8.8*   Liver Function Tests:  Recent Labs Lab 06/20/15 2038 06/21/15 0413 06/23/15 0500  AST ALT 21 19 16*  ALKPHOS 52 43 47  BILITOT 0.9 0.6 0.9  PROT 7.8 6.9 6.9  ALBUMIN 4.1 3.5 3.5  Recent Labs Lab 06/20/15 2038 06/21/15 0835 06/23/15 0500  LIPASE 550* 141* 35   No results for input(s): AMMONIA in the last 168 hours. CBC:  Recent Labs Lab 06/20/15 2038 06/21/15 0413 06/23/15 0500  WBC 11.6* 7.1 4.1  NEUTROABS 9.4*  --   --   HGB 16.1 14.1 13.4  HCT 45.0 39.6 39.1  MCV 86.9 87.2 86.7  PLT 228 182 195   Cardiac Enzymes: No results for input(s): CKTOTAL, CKMB, CKMBINDEX, TROPONINI in the last 168 hours. BNP (last  3 results) No results for input(s): PROBNP in the last 8760 hours. CBG:  Recent Labs Lab 06/23/15 0825 06/23/15 1154 06/23/15 1639 06/23/15 2106 06/24/15 0651  GLUCAP 82 172* 110* 130* 87    Recent Results (from the past 240 hour(s))  MRSA PCR Screening     Status: None   Collection Time: 06/21/15  2:03 AM  Result Value Ref Range Status   MRSA by PCR NEGATIVE NEGATIVE Final    Comment:        The GeneXpert MRSA Assay (FDA approved for NASAL specimens only), is one component of a comprehensive MRSA colonization surveillance program. It is not intended to diagnose MRSA infection nor to guide or monitor treatment for MRSA infections.          Studies: No results found.      Scheduled Meds: . docusate sodium  100 mg Oral BID  . enoxaparin (LOVENOX) injection  40 mg Subcutaneous Q24H  . gabapentin  300 mg Oral TID  . insulin aspart  0-9 Units Subcutaneous TID WC  . lisinopril  5 mg Oral Daily  . simvastatin  5 mg Oral QHS  . tamsulosin  0.8 mg Oral Daily   Continuous Infusions: . sodium chloride 50 mL/hr at 06/23/15 0900    Principal Problem:   Pancreatitis Active Problems:   DM (diabetes mellitus) type II controlled, neurological manifestation (HCC)   Essential hypertension   Acute pancreatitis    Time spent: 25 minutes    Alexsandro Salek, DO [facility] Pager [phone]  If 7PM-7AM, please contact night-coverage [email or web address] Password [email or web address] 06/24/2015, 7:55 AM    LOS: 4 days

## 2015-06-24 NOTE — Plan of Care (Signed)
Problem: Food- and Nutrition-Related Knowledge Deficit (NB-1.1) Goal: Nutrition education Formal process to instruct or train a patient/client in a skill or to impart knowledge to help patients/clients voluntarily manage or modify food choices and eating behavior to maintain or improve health. Outcome: Completed/Met Date Met:  06/24/15 RD consulted for diet education.   Patient was given handout "Pancreatitis Nutrition Therapy" from the Academy of Nutrition and Dietetics Manual.  Pt was given a list of foods recommended and not recommended. Discussed patient's dietary recall. Encouraged consumption of fresh fruits and vegetables as well as whole grain carbohydrates. Discussed limiting fat in the diet. Additionally discussed food safety practices. Teach back method used.   Expect good compliance.  Corrin Parker, MS, RD, LDN Pager # [phone] After hours/ weekend pager # [phone]

## 2015-06-25 DIAGNOSIS — K859 Acute pancreatitis without necrosis or infection, unspecified: Principal | ICD-10-CM

## 2015-06-25 LAB — GLUCOSE, CAPILLARY: GLUCOSE-CAPILLARY: 124 mg/dL — AB (ref 65–99)

## 2015-06-25 MED ORDER — DOCUSATE SODIUM 100 MG PO CAPS
100.0000 mg | ORAL_CAPSULE | Freq: Two times a day (BID) | ORAL | Status: DC
Start: 2015-06-25 — End: 2020-09-09

## 2015-06-25 MED ORDER — OXYCODONE HCL 10 MG PO TABS: 10.0000 mg | ORAL_TABLET | ORAL | Status: DC | PRN

## 2015-06-25 NOTE — Discharge Summary (Signed)
Physician Discharge Summary  Frederick Morales ZOX:[id number] DOB: 1956/03/28 DOA: 06/20/2015  PCP: No PCP Per Patient  Admit date: 06/20/2015 Discharge date: 06/25/2015  Time spent: 35 minutes  Recommendations for Outpatient Follow-up:  1. Follow up with GI at [facility]  Discharge Diagnoses:  Principal Problem:   Pancreatitis Active Problems:   DM (diabetes mellitus) type II controlled, neurological manifestation (HCC)   Essential hypertension   Acute pancreatitis   Discharge Condition: improved  Diet recommendation: cardiac  Filed Weights   06/21/15 0045  Weight: 84.4 kg (186 lb 1.1 oz)    History of present illness:  Frederick Morales is a 59 y.o. male with a past medical history significant for Hep C recently cleared on Harvoni June 2016, NIDDM, chronic pain, and HTN who presents with several days abdominal pain.  The patient notes gradual onset of epigastric pain over the last several days. He initially thought this was from too many sit-ups, but today the pain was so severe he couldn't stand. He had decreased appetite, and when he did try to eat something the pain became much more severe. He's had no jaundice, vomiting, diarrhea, or fever and chills. He denies recent alcohol use, or illicit drug use. He has not started any new medications in the last month.  In the ED, patient was tachycardic to 131, hypertensive, has highly elevated white count, and mild hyponatremia. The transaminases and bilirubin were normal. The lactic acid level was normal. The lipase was elevated and a CT of the abdomen and pelvis showed pancreatic stranding. He was admitted to [facility] for pancreatitis.  Hospital Course:  Acute pancreatitis - Unclear etiology. No history of alcohol use, no gallstones on abdominal ultrasound and lipid panel unremarkable. - Lipase was 550 on admission >reduced to 141 - CT abdomen confirms acute pancreatitis with moderate inflammation but no complicating features. -  Consider outpatient GI follow-up for possible EUS  Type II DM, controlled - DC metformin and glipizide while inpatient. Treat with NovoLog SSI. Controlled. Transient hypoglycemia last evening-monitor with oral intake.  Essential hypertension - Continue lisinopril. Uncontrolled-likely precipitated by pain  Hepatitis C - Recently cleared on Harvoni   Procedures:    Consultations:    Discharge Exam: Filed Vitals:   06/25/15 0610  BP: 142/87  Pulse: 72  Temp: 98.1 F (36.7 C)  Resp: 18    General: awake, NAD   Discharge Instructions   Discharge Instructions    Diet - low sodium heart healthy    Complete by:  As directed      Diet Carb Modified    Complete by:  As directed      Increase activity slowly    Complete by:  As directed           Current Discharge Medication List    START taking these medications   Details  docusate sodium (COLACE) 100 MG capsule Take 1 capsule (100 mg total) by mouth 2 (two) times daily. Qty: 10 capsule, Refills: 0    oxyCODONE 10 MG TABS Take 1 tablet (10 mg total) by mouth every 4 (four) hours as needed for moderate pain. Qty: 10 tablet, Refills: 0      CONTINUE these medications which have NOT CHANGED   Details  gabapentin (NEURONTIN) 300 MG capsule Take 300 mg by mouth 3 (three) times daily.    glipiZIDE (GLUCOTROL) 5 MG tablet Take 2.5 mg by mouth 2 (two) times daily before a meal.     lisinopril (PRINIVIL,ZESTRIL) 5 MG  tablet Take 5 mg by mouth daily.    metFORMIN (GLUCOPHAGE) 500 MG tablet Take 1,000 mg by mouth 2 (two) times daily with a meal.     simvastatin (ZOCOR) 5 MG tablet Take 5 mg by mouth at bedtime.    tamsulosin (FLOMAX) 0.4 MG CAPS capsule Take 0.8 mg by mouth daily.     traMADol (ULTRAM) 50 MG tablet Take 1 tablet (50 mg total) by mouth every 6 (six) hours as needed. Qty: 20 tablet, Refills: 0       Allergies  Allergen Reactions  . Penicillins Swelling    Facial swelling Has patient had a PCN  reaction causing immediate rash, facial/tongue/throat swelling, SOB or lightheadedness with hypotension: No Haspatient had a PCN reaction causing severe rash involving mucus membranes or skin necrosis/No Has patient had a PCN reaction that required hospitalization /No Has patient had a PCN reaction occurring within the last 10 years: No If all of the above answers are "NO", then may proceed with Cephalosporin use.    Follow-up Information    Please follow up.   Why:  keep follow up at [facility]       The results of significant diagnostics from this hospitalization (including imaging, microbiology, ancillary and laboratory) are listed below for reference.    Significant Diagnostic Studies: US Abdomen Complete  06/21/2015  CLINICAL DATA:  Acute pancreatitis.  Abdominal complaints. EXAM: ULTRASOUND ABDOMEN COMPLETE COMPARISON:  CT abdomen and pelvis 06/20/2015 FINDINGS: Gallbladder: No gallstones or wall thickening visualized. No sonographic Murphy sign noted. Common bile duct: Diameter: 4.5 mm, normal Liver: No focal lesion identified. Within normal limits in parenchymal echogenicity. IVC: No abnormality visualized. Pancreas: Not visualized due to overlying bowel gas. Spleen: Size and appearance within normal limits. Right Kidney: Length: 12.1 cm. Echogenicity within normal limits. No mass or hydronephrosis visualized. Left Kidney: Length: 12.9 cm. Echogenicity within normal limits. No mass or hydronephrosis visualized. Abdominal aorta: Not visualized due to overlying bowel gas. Other findings: None. IMPRESSION: No acute abnormalities demonstrated. Pancreas not visualized due to overlying bowel gas. Electronically Signed   By: Burman Nieves M.D.   On: 06/21/2015 02:12   Ct Abdomen Pelvis W Contrast  06/20/2015  CLINICAL DATA:  59 year old male with mid and lower abdominal pain for 2 days. Initial encounter. Current history of hepatitis-C. EXAM: CT ABDOMEN AND PELVIS WITH CONTRAST TECHNIQUE:  Multidetector CT imaging of the abdomen and pelvis was performed using the standard protocol following bolus administration of intravenous contrast. CONTRAST:  OMNIPAQUE IOHEXOL 300 MG/ML  SOLN COMPARISON:  CT Abdomen and Pelvis 10/07/2010. FINDINGS: Dependent atelectasis at both lung bases. No pericardial or pleural effusion. Degenerative changes in the spine. No acute osseous abnormality identified. No pelvic free fluid.  Negative urinary bladder and distal colon. Redundant sigmoid with retained stool. Moderate diverticulosis throughout the left colon, no active inflammation. Retained stool in the transverse colon. The cecum is on a lax mesentery, located in the right upper quadrant. Appendix not identified. No pericecal inflammation. No dilated small bowel. Decompressed stomach. Moderate inflammation surrounding the pancreas in the lesser sac with secondary involvement of the duodenum and porta hepatis. The pancreatic head and uncinate process appear most inflamed, with preserved enhancement. The pancreatic tail appears relatively normal. Inflammatory stranding continues to the root of the small bowel mesenteric. Portal venous system remains patent. Major arterial structures are patent. Aortoiliac calcified atherosclerosis noted. Small peripancreatic lymph nodes. Small duodenum diverticulum. Trace perihepatic free fluid (series 21, image 17). Stable, negative liver otherwise.  Secondary inflammation at the gallbladder neck, but the gallbladder otherwise appears stable and within normal limits. Spleen and adrenal glands within normal limits. Bilateral renal enhancement and contrast excretion within normal limits. No fluid collection. No pneumoperitoneum. IMPRESSION: 1. Acute pancreatitis with moderate inflammation secondarily involving the duodenum and porta hepatis. Trace free fluid. No fluid collection or other complicating features identified. 2. Otherwise stable abdomen and pelvis. 3. Mild pulmonary  atelectasis. Electronically Signed   By: Odessa FlemingH  Hall M.D.   On: 06/20/2015 23:00    Microbiology: Recent Results (from the past 240 hour(s))  MRSA PCR Screening     Status: None   Collection Time: 06/21/15  2:03 AM  Result Value Ref Range Status   MRSA by PCR NEGATIVE NEGATIVE Final    Comment:        The GeneXpert MRSA Assay (FDA approved for NASAL specimens only), is one component of a comprehensive MRSA colonization surveillance program. It is not intended to diagnose MRSA infection nor to guide or monitor treatment for MRSA infections.      Labs: Basic Metabolic Panel:  Recent Labs Lab 06/20/15 2038 06/21/15 0413 06/23/15 0500  NA 132* 138 132*  K 4.2 4.3 3.6  CL 98* 102 96*  CO2 23 28 27   GLUCOSE 218* 123* 113*  BUN 16 12 10   CREATININE 0.86 0.86 0.83  CALCIUM 9.7 8.9 8.8*   Liver Function Tests:  Recent Labs Lab 06/20/15 2038 06/21/15 0413 06/23/15 0500  AST 21 16 17   ALT 21 19 16*  ALKPHOS 52 43 47  BILITOT 0.9 0.6 0.9  PROT 7.8 6.9 6.9  ALBUMIN 4.1 3.5 3.5    Recent Labs Lab 06/20/15 2038 06/21/15 0835 06/23/15 0500  LIPASE 550* 141* 35   No results for input(s): AMMONIA in the last 168 hours. CBC:  Recent Labs Lab 06/20/15 2038 06/21/15 0413 06/23/15 0500  WBC 11.6* 7.1 4.1  NEUTROABS 9.4*  --   --   HGB 16.1 14.1 13.4  HCT 45.0 39.6 39.1  MCV 86.9 87.2 86.7  PLT 228 182 195   Cardiac Enzymes: No results for input(s): CKTOTAL, CKMB, CKMBINDEX, TROPONINI in the last 168 hours. BNP: BNP (last 3 results) No results for input(s): BNP in the last 8760 hours.  ProBNP (last 3 results) No results for input(s): PROBNP in the last 8760 hours.  CBG:  Recent Labs Lab 06/24/15 0651 06/24/15 1106 06/24/15 1631 06/24/15 2143 06/25/15 0608  GLUCAP 87 118* 176* 76 124*       Signed:  Debby Clyne  [facility] 06/25/2015, 8:39 AM

## 2015-08-14 ENCOUNTER — Encounter (HOSPITAL_COMMUNITY): Payer: Self-pay | Admitting: *Deleted

## 2015-08-14 ENCOUNTER — Emergency Department (HOSPITAL_COMMUNITY): Payer: Medicaid Other

## 2015-08-14 ENCOUNTER — Emergency Department (HOSPITAL_COMMUNITY)
Admission: EM | Admit: 2015-08-14 | Discharge: 2015-08-14 | Disposition: A | Discharge status: Home / Self Care | Payer: Medicaid Other | Attending: Emergency Medicine | Admitting: Emergency Medicine

## 2015-08-14 DIAGNOSIS — Z8614 Personal history of Methicillin resistant Staphylococcus aureus infection: Secondary | ICD-10-CM | POA: Diagnosis not present

## 2015-08-14 DIAGNOSIS — E119 Type 2 diabetes mellitus without complications: Secondary | ICD-10-CM | POA: Diagnosis not present

## 2015-08-14 DIAGNOSIS — Z8719 Personal history of other diseases of the digestive system: Secondary | ICD-10-CM | POA: Diagnosis not present

## 2015-08-14 DIAGNOSIS — W01198A Fall on same level from slipping, tripping and stumbling with subsequent striking against other object, initial encounter: Secondary | ICD-10-CM | POA: Insufficient documentation

## 2015-08-14 DIAGNOSIS — S6992XA Unspecified injury of left wrist, hand and finger(s), initial encounter: Secondary | ICD-10-CM | POA: Insufficient documentation

## 2015-08-14 DIAGNOSIS — I10 Essential (primary) hypertension: Secondary | ICD-10-CM | POA: Diagnosis not present

## 2015-08-14 DIAGNOSIS — Y9389 Activity, other specified: Secondary | ICD-10-CM | POA: Insufficient documentation

## 2015-08-14 DIAGNOSIS — Y9289 Other specified places as the place of occurrence of the external cause: Secondary | ICD-10-CM | POA: Insufficient documentation

## 2015-08-14 DIAGNOSIS — Z8619 Personal history of other infectious and parasitic diseases: Secondary | ICD-10-CM | POA: Insufficient documentation

## 2015-08-14 DIAGNOSIS — Z880 Allergy status to penicillin: Secondary | ICD-10-CM | POA: Diagnosis not present

## 2015-08-14 DIAGNOSIS — Y998 Other external cause status: Secondary | ICD-10-CM | POA: Insufficient documentation

## 2015-08-14 DIAGNOSIS — Z87891 Personal history of nicotine dependence: Secondary | ICD-10-CM | POA: Diagnosis not present

## 2015-08-14 DIAGNOSIS — M79642 Pain in left hand: Secondary | ICD-10-CM

## 2015-08-14 MED ORDER — IBUPROFEN 400 MG PO TABS
800.0000 mg | ORAL_TABLET | Freq: Once | ORAL | Status: DC
  Filled 2015-08-14: qty 2

## 2015-08-14 NOTE — ED Notes (Signed)
Pt refused motrin

## 2015-08-14 NOTE — ED Notes (Signed)
Painful lt hand he fell yesterday and he has pain in his fingers with s\weling

## 2015-08-14 NOTE — ED Notes (Signed)
Pt given ice pack for left hand

## 2015-08-14 NOTE — ED Provider Notes (Signed)
CSN: [id number]     Arrival date & time 08/14/15  1526 History   First MD Initiated Contact with Patient 08/14/15 1559     Chief Complaint  Patient presents with  . Hand Injury    HPI   Frederick Morales is a 59 y.o. male with a PMH of arthritis, HTN, hepatitis C, DM who presents to the ED with left hand pain and swelling. He states he fell and "jammed his hand" yesterday and reports his symptoms have been constant since that time. He states movement exacerbates his pain. He has tried ice for symptom relief, which has been minimally effective. He denies numbness, weakness, paresthesia, additional injury.   Past Medical History  Diagnosis Date  . Arthritis   . MRSA (methicillin resistant Staphylococcus aureus)   . Blood transfusion   . Hypertension   . Hepatitis C     Hep C, [place] and cleared in Jun 2016  . Diabetes mellitus     TYPE 2  . Acute pancreatitis 06/21/2015   Past Surgical History  Procedure Laterality Date  . Appendectomy     Family History  Problem Relation Age of Onset  . Diabetes Sister   . Tuberculosis Maternal Grandmother    Social History  Substance Use Topics  . Smoking status: Former Games developer  . Smokeless tobacco: Never Used     Comment: quit smoking in the 1980"s  . Alcohol Use: No     Comment: Quit 30 years ago     Review of Systems  Musculoskeletal: Positive for arthralgias.  Neurological: Negative for weakness and numbness.      Allergies  Penicillins  Home Medications   Prior to Admission medications   Medication Sig Start Date End Date Taking? Authorizing Provider  docusate sodium (COLACE) 100 MG capsule Take 1 capsule (100 mg total) by mouth 2 (two) times daily. 06/25/15   Hoyt Koch, DO  gabapentin (NEURONTIN) 300 MG capsule Take 300 mg by mouth 3 (three) times daily.    Historical Provider, MD  glipiZIDE (GLUCOTROL) 5 MG tablet Take 2.5 mg by mouth 2 (two) times daily before a meal.     Historical Provider, MD   lisinopril (PRINIVIL,ZESTRIL) 5 MG tablet Take 5 mg by mouth daily.    Historical Provider, MD  metFORMIN (GLUCOPHAGE) 500 MG tablet Take 1,000 mg by mouth 2 (two) times daily with a meal.     Historical Provider, MD  oxyCODONE 10 MG TABS Take 1 tablet (10 mg total) by mouth every 4 (four) hours as needed for moderate pain. 06/25/15   Hoyt Koch, DO  simvastatin (ZOCOR) 5 MG tablet Take 5 mg by mouth at bedtime.    Historical Provider, MD  tamsulosin (FLOMAX) 0.4 MG CAPS capsule Take 0.8 mg by mouth daily.     Historical Provider, MD  traMADol (ULTRAM) 50 MG tablet Take 1 tablet (50 mg total) by mouth every 6 (six) hours as needed. Patient taking differently: Take 50 mg by mouth every 6 (six) hours as needed for moderate pain.  05/23/14   Tatyana Kirichenko, PA-C    BP 112/78 mmHg  Pulse 91  Temp(Src) 97.7 F (36.5 C) (Oral)  Resp 18  SpO2 97% Physical Exam  Constitutional: He is oriented to person, place, and time. He appears well-developed and well-nourished. No distress.  HENT:  Head: Normocephalic and atraumatic.  Right Ear: External ear normal.  Left Ear: External ear normal.  Nose: Nose normal.  Eyes: Conjunctivae and EOM are  normal. Right eye exhibits no discharge. Left eye exhibits no discharge. No scleral icterus.  Neck: Normal range of motion. Neck supple.  Cardiovascular: Normal rate and regular rhythm.   Pulmonary/Chest: Effort normal and breath sounds normal. No respiratory distress.  Musculoskeletal: He exhibits edema and tenderness.  Diffuse TTP of left wrist and hand with mild edema and decreased range of motion due to pain. Decreased grip strength due to pain. Sensation intact. Distal pulses intact.  Neurological: He is alert and oriented to person, place, and time.  Skin: Skin is warm and dry. He is not diaphoretic.  Psychiatric: He has a normal mood and affect. His behavior is normal.  Nursing note and vitals reviewed.   ED Course  Procedures  (including critical care time)  Labs Review Labs Reviewed - No data to display  Imaging Review Dg Wrist Complete Left  08/14/2015  CLINICAL DATA:  Left wrist pain fall today, left lateral hand pain EXAM: LEFT WRIST - COMPLETE 3+ VIEW COMPARISON:  None. FINDINGS: Four views of the left wrist submitted. No acute fracture or subluxation. No radiopaque foreign body. IMPRESSION: Negative. Electronically Signed   By: Natasha MeadLiviu  Pop M.D.   On: 08/14/2015 17:01   Dg Hand Complete Left  08/14/2015  CLINICAL DATA:  Fall, left hand/ wrist pain EXAM: LEFT HAND - COMPLETE 3+ VIEW COMPARISON:  None. FINDINGS: No fracture or dislocation is seen. Mild irregularity at the 1st MCP joint without definite fracture. Mild degenerative changes at the 1st IP joint and 2nd MCP joint. The visualized soft tissues are unremarkable. IMPRESSION: No fracture or dislocation is seen. Electronically Signed   By: Charline BillsSriyesh  Krishnan M.D.   On: 08/14/2015 17:00     I have personally reviewed and evaluated these images as part of my medical decision-making.   EKG Interpretation None      MDM   Final diagnoses:  Left hand pain    59 year old male presents with left hand injury, which occurred last night. He reports constant pain and swelling since that time. He denies numbness, weakness, paresthesia.  Patient is afebrile. Vital signs stable. Diffuse tenderness to palpation of left wrist and hand with mild edema and decreased range of motion due to pain. Decreased grip strength due to pain. Sensation intact. Distal pulses intact.  Will obtain imaging of left wrist and hand. Patient given ibuprofen and ice for pain.  Patient eloped from the ED prior to imaging being resulted. Imaging negative for fracture or dislocation. Called patient to advise of results. Instructed him to follow-up with his PCP. Patient verbalizes his understanding.  BP 112/78 mmHg  Pulse 91  Temp(Src) 97.7 F (36.5 C) (Oral)  Resp 18  SpO2  97%    Frederick Gemmalizabeth C Westfall, PA-C 08/14/15 1726  Dione Boozeavid Glick, MD 08/14/15 [phone]

## 2015-08-14 NOTE — ED Notes (Signed)
Pt walked out approx 30 minutes agoi after he was told we were giving him motrin for pain.  He did not let the  Registration finish

## 2019-07-06 HISTORY — PX: COLONOSCOPY: SHX174

## 2020-06-12 ENCOUNTER — Other Ambulatory Visit: Payer: Self-pay

## 2020-06-12 ENCOUNTER — Ambulatory Visit: Payer: Medicaid Other | Admitting: Dermatology

## 2020-06-12 ENCOUNTER — Encounter: Payer: Self-pay | Admitting: Dermatology

## 2020-06-12 DIAGNOSIS — K130 Diseases of lips: Secondary | ICD-10-CM | POA: Diagnosis not present

## 2020-06-12 DIAGNOSIS — L710 Perioral dermatitis: Secondary | ICD-10-CM | POA: Diagnosis not present

## 2020-06-12 DIAGNOSIS — L739 Follicular disorder, unspecified: Secondary | ICD-10-CM | POA: Diagnosis not present

## 2020-06-12 MED ORDER — DOXYCYCLINE HYCLATE 20 MG PO TABS: 20.0000 mg | ORAL_TABLET | Freq: Two times a day (BID) | ORAL | 0 refills | Status: DC

## 2020-06-12 NOTE — Progress Notes (Signed)
   New Patient Visit  Subjective  Frederick Morales is a 64 y.o. male who presents for the following: Area of concern (Around mouth, neck and on hands).  Patient presents today for area of concern around his mouth, his hands and on his neck, was given Cephalexin, Acyclovir, nystatin cream and zovirax cream by PCP, and patient states that these medication do not help. The antibiotic seems to make it go away while taking but when course is complete it comes back. This has been present for past month or so.   The following portions of the chart were reviewed this encounter and updated as appropriate:  Tobacco  Allergies  Meds  Problems  Med Hx  Surg Hx  Fam Hx      Review of Systems:  No other skin or systemic complaints except as noted in HPI or Assessment and Plan.  Objective  Well appearing patient in no apparent distress; mood and affect are within normal limits.  A focused examination was performed including head, including the scalp, face, neck, nose, ears, eyelids, and lips, and hands. Relevant physical exam findings are noted in the Assessment and Plan.  Objective  Around mouth: Scaly erythematous papules   Objective  Right Anterior Neck: Follicular-based erythematous papules and pustules.   Objective  Left Oral Commissure: Erythematous patch oral commisures   Assessment & Plan  Perioral dermatitis Around mouth  Start Doxycycline 20mg  1 po BID with food  Doxycycline should be taken with food to prevent nausea. Do not lay down for 30 minutes after taking. Be cautious with sun exposure and use good sun protection while on this medication.   doxycycline (PERIOSTAT) 20 MG tablet - Around mouth  Folliculitis Right Anterior Neck  Will start Doxycycline 20 mg BID    Angular cheilitis Left Oral Commissure  Recommend fluoride free Sensodyne toothpaste  Recommend washing dentures with antibacterial cleanser nightly  Call if bothersome in 2 weeks  Return in  about 2 months (around 08/12/2020) for perioral dermatitis.  I14/02/2020, CMA, am acting as scribe for Leward Quan, MD .  Documentation: I have reviewed the above documentation for accuracy and completeness, and I agree with the above.  Darden Dates, MD

## 2020-06-12 NOTE — Patient Instructions (Addendum)
Recommend daily broad spectrum sunscreen SPF 30+ to sun-exposed areas, reapply every 2 hours as needed. Call for new or changing lesions.  Recommend Sensodyne toothpaste fluoride free Recommend washing dentures antibacterial denture cleaner every night  Call in 2 weeks if Angular Cheilitis is bothersome (chapped Lips in corners of mouth)

## 2020-07-01 ENCOUNTER — Encounter: Payer: Self-pay | Admitting: Dermatology

## 2020-07-04 ENCOUNTER — Other Ambulatory Visit: Payer: Self-pay | Admitting: Dermatology

## 2020-07-04 DIAGNOSIS — L710 Perioral dermatitis: Secondary | ICD-10-CM

## 2020-07-27 ENCOUNTER — Other Ambulatory Visit: Payer: Self-pay | Admitting: Dermatology

## 2020-07-27 DIAGNOSIS — L710 Perioral dermatitis: Secondary | ICD-10-CM

## 2020-08-21 ENCOUNTER — Ambulatory Visit: Payer: Medicaid Other | Admitting: Dermatology

## 2020-08-21 ENCOUNTER — Other Ambulatory Visit: Payer: Self-pay

## 2020-08-21 DIAGNOSIS — K130 Diseases of lips: Secondary | ICD-10-CM | POA: Diagnosis not present

## 2020-08-21 DIAGNOSIS — L710 Perioral dermatitis: Secondary | ICD-10-CM

## 2020-08-21 MED ORDER — MUPIROCIN 2 % EX OINT: 1.0000 "application " | TOPICAL_OINTMENT | Freq: Two times a day (BID) | CUTANEOUS | 0 refills | Status: DC

## 2020-08-21 MED ORDER — KETOCONAZOLE 2 % EX CREA: 1.0000 "application " | TOPICAL_CREAM | Freq: Two times a day (BID) | CUTANEOUS | 0 refills | Status: AC

## 2020-08-21 MED ORDER — HYDROCORTISONE 2.5 % EX OINT: TOPICAL_OINTMENT | Freq: Two times a day (BID) | CUTANEOUS | 0 refills | Status: DC

## 2020-08-21 MED ORDER — DOXYCYCLINE MONOHYDRATE 100 MG PO CAPS: 100.0000 mg | ORAL_CAPSULE | Freq: Two times a day (BID) | ORAL | 1 refills | Status: DC

## 2020-08-21 NOTE — Patient Instructions (Signed)
Start HC 2.5 % ointment. Start Mupirocin ointment. Start Ketoconazole cream. Apply to affected areas, followed by [facility] ointment and mupirocin ointment mixed together BID for two weeks.

## 2020-08-21 NOTE — Progress Notes (Signed)
   Follow-Up Visit   Subjective  Frederick Morales is a 64 y.o. male who presents for the following: Follow-up (2 mo f/u for perioral dermatitis and folliculitis. Pt treating with Doxycycline 20 mg BID. Pt states that there has been no improvement. He says that he is still getting new bumps on his face and cracks on his mouth. ).  The following portions of the chart were reviewed this encounter and updated as appropriate:  Tobacco  Allergies  Meds  Problems  Med Hx  Surg Hx  Fam Hx      Review of Systems: No other skin or systemic complaints except as noted in HPI or Assessment and Plan.   Objective  Well appearing patient in no apparent distress; mood and affect are within normal limits.  A focused examination was performed including face. Relevant physical exam findings are noted in the Assessment and Plan.  Objective  face: Sactered inflammatory papules at low face   Objective  Lips: Crusted fissures at oral commissures  Assessment & Plan  Perioral dermatitis face  D/c doxycycline 20 mg Start Doxycycline 100 mg monohydrate BID with food #60 1rf  Doxycycline should be taken with food to prevent nausea. Do not lay down for 30 minutes after taking. Be cautious with sun exposure and use good sun protection while on this medication.   doxycycline (MONODOX) 100 MG capsule - face  Angular cheilitis Lips  Start HC 2.5 % ointment. Start Mupirocin ointment. Start Ketoconazole cream. Apply to affected areas, followed by [facility] ointment and mupirocin ointment mixed together BID for two weeks.     hydrocortisone 2.5 % ointment - Lips  mupirocin ointment (BACTROBAN) 2 % - Lips  ketoconazole (NIZORAL) 2 % cream - Lips  Return in about 6 weeks (around 10/02/2020) for 4-6 weeks.   I, Epifania Gore, CMA, am acting as scribe for Darden Dates, MD.  Documentation: I have reviewed the above documentation for accuracy and completeness, and I agree with the above.  Darden Dates, MD

## 2020-08-29 ENCOUNTER — Encounter: Payer: Self-pay | Admitting: Dermatology

## 2020-09-10 ENCOUNTER — Other Ambulatory Visit: Payer: Self-pay | Admitting: Dermatology

## 2020-09-10 DIAGNOSIS — L710 Perioral dermatitis: Secondary | ICD-10-CM

## 2020-09-13 ENCOUNTER — Other Ambulatory Visit
Admission: RE | Admit: 2020-09-13 | Discharge: 2020-09-13 | Disposition: A | Discharge status: Home / Self Care | Payer: Medicaid Other | Source: Ambulatory Visit | Attending: Orthopedic Surgery | Admitting: Orthopedic Surgery

## 2020-09-13 ENCOUNTER — Other Ambulatory Visit: Payer: Self-pay

## 2020-09-13 ENCOUNTER — Other Ambulatory Visit: Payer: Self-pay | Admitting: Orthopedic Surgery

## 2020-09-13 DIAGNOSIS — Z01818 Encounter for other preprocedural examination: Secondary | ICD-10-CM | POA: Insufficient documentation

## 2020-09-13 DIAGNOSIS — E119 Type 2 diabetes mellitus without complications: Secondary | ICD-10-CM | POA: Diagnosis not present

## 2020-09-13 DIAGNOSIS — I10 Essential (primary) hypertension: Secondary | ICD-10-CM | POA: Diagnosis not present

## 2020-09-13 DIAGNOSIS — Z20822 Contact with and (suspected) exposure to covid-19: Secondary | ICD-10-CM | POA: Insufficient documentation

## 2020-09-13 LAB — CBC WITH DIFFERENTIAL/PLATELET
Abs Immature Granulocytes: 0.02 10*3/uL (ref 0.00–0.07)
Basophils Absolute: 0 10*3/uL (ref 0.0–0.1)
Basophils Relative: 1 %
Eosinophils Absolute: 0.4 10*3/uL (ref 0.0–0.5)
Eosinophils Relative: 7 %
HCT: 39.6 % (ref 39.0–52.0)
Hemoglobin: 13.6 g/dL (ref 13.0–17.0)
Immature Granulocytes: 0 %
Lymphocytes Relative: 27 %
Lymphs Abs: 1.4 10*3/uL (ref 0.7–4.0)
MCH: 29.6 pg (ref 26.0–34.0)
MCHC: 34.3 g/dL (ref 30.0–36.0)
MCV: 86.1 fL (ref 80.0–100.0)
Monocytes Absolute: 0.4 10*3/uL (ref 0.1–1.0)
Monocytes Relative: 7 %
Neutro Abs: 3.2 10*3/uL (ref 1.7–7.7)
Neutrophils Relative %: 58 %
Platelets: 278 10*3/uL (ref 150–400)
RBC: 4.6 MIL/uL (ref 4.22–5.81)
RDW: 12.2 % (ref 11.5–15.5)
WBC: 5.4 10*3/uL (ref 4.0–10.5)
nRBC: 0 % (ref 0.0–0.2)

## 2020-09-13 LAB — BASIC METABOLIC PANEL
Anion gap: 10 (ref 5–15)
BUN: 11 mg/dL (ref 8–23)
CO2: 28 mmol/L (ref 22–32)
Calcium: 9.6 mg/dL (ref 8.9–10.3)
Chloride: 99 mmol/L (ref 98–111)
Creatinine, Ser: 0.71 mg/dL (ref 0.61–1.24)
GFR, Estimated: >60 mL/min (ref >60)
Glucose, Bld: 100 mg/dL — ABNORMAL HIGH (ref 70–99)
Potassium: 4.1 mmol/L (ref 3.5–5.1)
Sodium: 137 mmol/L (ref 135–145)

## 2020-09-13 LAB — APTT: aPTT: 34 seconds (ref 24–36)

## 2020-09-13 LAB — PROTIME-INR
INR: 1.1 (ref 0.8–1.2)
Prothrombin Time: 13.8 seconds (ref 11.4–15.2)

## 2020-09-13 NOTE — Patient Instructions (Signed)
Your procedure is scheduled on: 09/17/20 Report to [name]. To find out your arrival time please call [phone] between 1PM - 3PM on 09/16/20.  Remember: Instructions that are not followed completely may result in serious medical risk, up to and including death, or upon the discretion of your surgeon and anesthesiologist your surgery may need to be rescheduled.     _X__ 1. Do not eat food after midnight the night before your procedure.                 No gum chewing or hard candies. You may drink clear liquids up to 2 hours                 before you are scheduled to arrive for your surgery- DO not drink clear                 liquids within 2 hours of the start of your surgery.                 Clear Liquids include:  water, apple juice without pulp, clear carbohydrate                 drink such as Clearfast or Gatorade, Black Coffee or Tea (Do not add                 anything to coffee or tea). Diabetics water only  __X__2.  On the morning of surgery brush your teeth with toothpaste and water, you                 may rinse your mouth with mouthwash if you wish.  Do not swallow any              toothpaste of mouthwash.     _X__ 3.  No Alcohol for 24 hours before or after surgery.   _X__ 4.  Do Not Smoke or use e-cigarettes For 24 Hours Prior to Your Surgery.                 Do not use any chewable tobacco products for at least 6 hours prior to                 surgery.  ____  5.  Bring all medications with you on the day of surgery if instructed.   __X__  6.  Notify your doctor if there is any change in your medical condition      (cold, fever, infections).     Do not wear jewelry, make-up, hairpins, clips or nail polish. Do not wear lotions, powders, or perfumes.  Do not shave 48 hours prior to surgery. Men may shave face and neck. Do not bring valuables to the hospital.    [facility] is not responsible for any belongings or  valuables.  Contacts, dentures/partials or body piercings may not be worn into surgery. Bring a case for your contacts, glasses or hearing aids, a denture cup will be supplied. Leave your suitcase in the car. After surgery it may be brought to your room. For patients admitted to the hospital, discharge time is determined by your treatment team.   Patients discharged the day of surgery will not be allowed to drive home.   Please read over the following fact sheets that you were given:   MRSA Information  __X__ Take these medicines the morning of surgery with A SIP OF WATER:  1. atorvastatin (LIPITOR) 40 MG tablet  2. doxycycline (MONODOX) 100 MG capsule  3. tamsulosin (FLOMAX) 0.4 MG CAPS capsule  4.  5.  6.  ____ Fleet Enema (as directed)   __X__ Use CHG Soap/SAGE wipes as directed  ____ Use inhalers on the day of surgery  __X__ Stop metformin/Janumet/Farxiga 2 days prior to surgery NONE Sunday OR Monday   ____ Take 1/2 of usual insulin dose the night before surgery. No insulin the morning          of surgery.   ____ Stop Blood Thinners Coumadin/Plavix/Xarelto/Pleta/Pradaxa/Eliquis/Effient/Aspirin  on   Or contact your Surgeon, Cardiologist or Medical Doctor regarding  ability to stop your blood thinners  __X__ Stop Anti-inflammatories 7 days before surgery such as Advil, Ibuprofen, Motrin,  BC or Goodies Powder, Naprosyn, Naproxen, Aleve, Aspirin   Tylenol may be taken if needed  __X__ Stop all herbal supplements, fish oil or vitamin E until after surgery.    ____ Bring C-Pap to the hospital.

## 2020-09-14 LAB — SARS CORONAVIRUS 2 (TAT 6-24 HRS): SARS Coronavirus 2: NEGATIVE

## 2020-09-17 ENCOUNTER — Ambulatory Visit: Admission: RE | Admit: 2020-09-17 | Payer: Medicaid Other | Source: Home / Self Care | Admitting: Orthopedic Surgery

## 2020-09-17 ENCOUNTER — Encounter: Admission: RE | Payer: Self-pay | Source: Home / Self Care

## 2020-09-17 SURGERY — ARTHROSCOPY, SHOULDER, WITH GLENOID LABRUM REPAIR
Anesthesia: General | Site: Shoulder | Laterality: Right

## 2020-10-03 ENCOUNTER — Other Ambulatory Visit: Payer: Self-pay

## 2020-10-03 ENCOUNTER — Ambulatory Visit: Payer: Medicaid Other | Admitting: Dermatology

## 2020-10-03 DIAGNOSIS — L570 Actinic keratosis: Secondary | ICD-10-CM

## 2020-10-03 DIAGNOSIS — K130 Diseases of lips: Secondary | ICD-10-CM | POA: Diagnosis not present

## 2020-10-03 DIAGNOSIS — L710 Perioral dermatitis: Secondary | ICD-10-CM | POA: Diagnosis not present

## 2020-10-03 MED ORDER — DOXYCYCLINE HYCLATE 20 MG PO TABS: 20.0000 mg | ORAL_TABLET | Freq: Two times a day (BID) | ORAL | 1 refills | Status: AC

## 2020-10-03 NOTE — Progress Notes (Signed)
   Follow-Up Visit   Subjective  Frederick Morales is a 65 y.o. male who presents for the following: Follow-up (Patient is here today for follow up on perioral dermatitis. He states he has been taking doxycycline and it has helped. He reports noticing some redness around his nose. ).  The following portions of the chart were reviewed this encounter and updated as appropriate:      Objective  Well appearing patient in no apparent distress; mood and affect are within normal limits.  A focused examination was performed including face. Relevant physical exam findings are noted in the Assessment and Plan.  Objective  Head - Anterior (Face): few pink macules  Objective  Lips: Angles clear but with cheilitis over upper and lower lips  Objective  right cheek: Erythematous thin papules/macules with gritty scale.   Assessment & Plan  Perioral dermatitis Head - Anterior (Face)  Clear today.   Decrease to doxycycline 20 mg monohydrate bid with food   Dc Doxycycline 100 mg monohydrate BID with food #60 1rf   Doxycycline should be taken with food to prevent nausea. Do not lay down for 30 minutes after taking. Be cautious with sun exposure and use good sun protection while on this medication.        Ordered Medications: doxycycline (PERIOSTAT) 20 MG tablet  Angular cheilitis Lips  For lips, apply HC 2.5% ointment twice a day as needed up to 1 week   For recurrence of angular cheilitis, start HC 2.5 % ointment. Start Mupirocin ointment. Start Ketoconazole cream. Apply to affected areas, followed by [facility] ointment and mupirocin ointment mixed together BID for two weeks.   Other Related Medications hydrocortisone 2.5 % ointment mupirocin ointment (BACTROBAN) 2 %  Actinic keratosis right cheek  Prior to procedure, discussed risks of blister formation, small wound, skin dyspigmentation, or rare scar following cryotherapy.    Destruction of lesion - right  cheek  Destruction method: cryotherapy   Informed consent: discussed and consent obtained   Lesion destroyed using liquid nitrogen: Yes   Cryotherapy cycles:  2 Outcome: patient tolerated procedure well with no complications   Post-procedure details: wound care instructions given    Return in about 2 months (around 12/04/2020) for follow up ak, angular cheilitis, perioral dermatis.  I, Asher Muir, CMA, am acting as scribe for Darden Dates, MD.  Documentation: I have reviewed the above documentation for accuracy and completeness, and I agree with the above.  Darden Dates, MD

## 2020-10-03 NOTE — Patient Instructions (Addendum)
Cryotherapy Aftercare  . Wash gently with soap and water everyday.   Marland Kitchen Apply Vaseline and Band-Aid daily until healed.   Topical steroids (such as triamcinolone, fluocinolone, fluocinonide, mometasone, clobetasol, halobetasol, betamethasone, hydrocortisone) can cause thinning and lightening of the skin if they are used for too long in the same area. Your physician has selected the right strength medicine for your problem and area affected on the body. Please use your medication only as directed by your physician to prevent side effects.   Doxycycline should be taken with food to prevent nausea. Do not lay down for 30 minutes after taking. Be cautious with sun exposure and use good sun protection while on this medication. Pregnant women should not take this medication.    For lips, apply HC 2.5% ointment twice a day as needed up to 1 week

## 2020-10-07 ENCOUNTER — Encounter: Payer: Self-pay | Admitting: Dermatology

## 2020-10-16 ENCOUNTER — Other Ambulatory Visit: Payer: Self-pay | Admitting: Dermatology

## 2020-10-16 DIAGNOSIS — L710 Perioral dermatitis: Secondary | ICD-10-CM

## 2020-10-21 ENCOUNTER — Other Ambulatory Visit: Payer: Self-pay | Admitting: Orthopedic Surgery

## 2020-10-25 ENCOUNTER — Other Ambulatory Visit
Admission: RE | Admit: 2020-10-25 | Discharge: 2020-10-25 | Disposition: A | Discharge status: Home / Self Care | Payer: Medicaid Other | Source: Ambulatory Visit | Attending: Orthopedic Surgery | Admitting: Orthopedic Surgery

## 2020-10-25 ENCOUNTER — Other Ambulatory Visit: Payer: Self-pay

## 2020-10-25 DIAGNOSIS — U071 COVID-19: Secondary | ICD-10-CM | POA: Diagnosis not present

## 2020-10-25 DIAGNOSIS — Z01812 Encounter for preprocedural laboratory examination: Secondary | ICD-10-CM | POA: Insufficient documentation

## 2020-10-26 LAB — SARS CORONAVIRUS 2 (TAT 6-24 HRS): SARS Coronavirus 2: POSITIVE — AB

## 2020-10-27 ENCOUNTER — Telehealth: Payer: Self-pay | Admitting: Physician Assistant

## 2020-10-27 NOTE — Telephone Encounter (Signed)
Called to discuss with Frederick Morales about Covid symptoms and the use of sotrovimab, remdisivir or oral therapies for those with mild to moderate Covid symptoms and at a high risk of hospitalization.     Pt does not qualify as pt has asymptomatic infection. Isolation precautions discussed. Advised to contact back for consideration should they develop symptoms. Patient verbalized understanding.   Pt is fully vaccinated and had a cold with cough a week or two ago. Did not test then. Now tested in anticipation of shoulder surgery and having no symptoms. Probably had symptomatic covid and testing persistently +. No treatment indicated at this time.    Patient Active Problem List   Diagnosis Date Noted  . DM (diabetes mellitus) type II controlled, neurological manifestation (HCC) 06/21/2015  . Essential hypertension 06/21/2015  . Acute pancreatitis 06/21/2015  . Pancreatitis 06/20/2015  . MRSA (methicillin resistant Staphylococcus aureus)   . Hepatitis C     Cline Crock PA-C

## 2020-10-29 ENCOUNTER — Encounter: Admission: RE | Payer: Self-pay | Source: Home / Self Care

## 2020-10-29 ENCOUNTER — Ambulatory Visit: Admission: RE | Admit: 2020-10-29 | Payer: Medicaid Other | Source: Home / Self Care | Admitting: Orthopedic Surgery

## 2020-10-29 SURGERY — ARTHROSCOPY, SHOULDER, WITH GLENOID LABRUM REPAIR
Anesthesia: General | Site: Shoulder | Laterality: Right

## 2020-11-19 ENCOUNTER — Inpatient Hospital Stay (HOSPITAL_COMMUNITY)
Admission: EM | Admit: 2020-11-19 | Discharge: 2020-11-21 | DRG: 439 | Disposition: A | Discharge status: Home / Self Care | Payer: Medicaid Other | Attending: Family Medicine | Admitting: Family Medicine

## 2020-11-19 ENCOUNTER — Emergency Department (HOSPITAL_COMMUNITY): Payer: Medicaid Other

## 2020-11-19 ENCOUNTER — Encounter (HOSPITAL_COMMUNITY): Payer: Self-pay | Admitting: Emergency Medicine

## 2020-11-19 ENCOUNTER — Observation Stay (HOSPITAL_COMMUNITY): Payer: Medicaid Other

## 2020-11-19 DIAGNOSIS — Z8616 Personal history of COVID-19: Secondary | ICD-10-CM

## 2020-11-19 DIAGNOSIS — Z9049 Acquired absence of other specified parts of digestive tract: Secondary | ICD-10-CM

## 2020-11-19 DIAGNOSIS — K5790 Diverticulosis of intestine, part unspecified, without perforation or abscess without bleeding: Secondary | ICD-10-CM | POA: Diagnosis present

## 2020-11-19 DIAGNOSIS — F909 Attention-deficit hyperactivity disorder, unspecified type: Secondary | ICD-10-CM | POA: Diagnosis present

## 2020-11-19 DIAGNOSIS — Z7982 Long term (current) use of aspirin: Secondary | ICD-10-CM

## 2020-11-19 DIAGNOSIS — E118 Type 2 diabetes mellitus with unspecified complications: Secondary | ICD-10-CM

## 2020-11-19 DIAGNOSIS — Z87891 Personal history of nicotine dependence: Secondary | ICD-10-CM

## 2020-11-19 DIAGNOSIS — R748 Abnormal levels of other serum enzymes: Secondary | ICD-10-CM | POA: Diagnosis present

## 2020-11-19 DIAGNOSIS — K859 Acute pancreatitis without necrosis or infection, unspecified: Principal | ICD-10-CM | POA: Diagnosis present

## 2020-11-19 DIAGNOSIS — Z79899 Other long term (current) drug therapy: Secondary | ICD-10-CM

## 2020-11-19 DIAGNOSIS — N400 Enlarged prostate without lower urinary tract symptoms: Secondary | ICD-10-CM | POA: Diagnosis present

## 2020-11-19 DIAGNOSIS — Z833 Family history of diabetes mellitus: Secondary | ICD-10-CM

## 2020-11-19 DIAGNOSIS — Z880 Allergy status to penicillin: Secondary | ICD-10-CM

## 2020-11-19 DIAGNOSIS — B1920 Unspecified viral hepatitis C without hepatic coma: Secondary | ICD-10-CM | POA: Diagnosis present

## 2020-11-19 DIAGNOSIS — I10 Essential (primary) hypertension: Secondary | ICD-10-CM | POA: Diagnosis present

## 2020-11-19 DIAGNOSIS — E1165 Type 2 diabetes mellitus with hyperglycemia: Secondary | ICD-10-CM

## 2020-11-19 DIAGNOSIS — Z8614 Personal history of Methicillin resistant Staphylococcus aureus infection: Secondary | ICD-10-CM

## 2020-11-19 DIAGNOSIS — E119 Type 2 diabetes mellitus without complications: Secondary | ICD-10-CM | POA: Diagnosis present

## 2020-11-19 DIAGNOSIS — E871 Hypo-osmolality and hyponatremia: Secondary | ICD-10-CM | POA: Diagnosis present

## 2020-11-19 DIAGNOSIS — Z7984 Long term (current) use of oral hypoglycemic drugs: Secondary | ICD-10-CM

## 2020-11-19 LAB — COMPREHENSIVE METABOLIC PANEL
ALT: 22 U/L (ref 0–44)
AST: 17 U/L (ref 15–41)
Albumin: 4 g/dL (ref 3.5–5.0)
Alkaline Phosphatase: 52 U/L (ref 38–126)
Anion gap: 9 (ref 5–15)
BUN: 10 mg/dL (ref 8–23)
CO2: 24 mmol/L (ref 22–32)
Calcium: 9.4 mg/dL (ref 8.9–10.3)
Chloride: 99 mmol/L (ref 98–111)
Creatinine, Ser: 0.72 mg/dL (ref 0.61–1.24)
GFR, Estimated: >60 mL/min (ref >60)
Glucose, Bld: 190 mg/dL — ABNORMAL HIGH (ref 70–99)
Potassium: 3.7 mmol/L (ref 3.5–5.1)
Sodium: 132 mmol/L — ABNORMAL LOW (ref 135–145)
Total Bilirubin: 1.2 mg/dL (ref 0.3–1.2)
Total Protein: 7.2 g/dL (ref 6.5–8.1)

## 2020-11-19 LAB — CBC WITH DIFFERENTIAL/PLATELET
Abs Immature Granulocytes: 0.03 10*3/uL (ref 0.00–0.07)
Basophils Absolute: 0 10*3/uL (ref 0.0–0.1)
Basophils Relative: 1 %
Eosinophils Absolute: 0.2 10*3/uL (ref 0.0–0.5)
Eosinophils Relative: 2 %
HCT: 42.8 % (ref 39.0–52.0)
Hemoglobin: 14.8 g/dL (ref 13.0–17.0)
Immature Granulocytes: 0 %
Lymphocytes Relative: 18 %
Lymphs Abs: 1.6 10*3/uL (ref 0.7–4.0)
MCH: 30.1 pg (ref 26.0–34.0)
MCHC: 34.6 g/dL (ref 30.0–36.0)
MCV: 87.2 fL (ref 80.0–100.0)
Monocytes Absolute: 0.8 10*3/uL (ref 0.1–1.0)
Monocytes Relative: 10 %
Neutro Abs: 6 10*3/uL (ref 1.7–7.7)
Neutrophils Relative %: 69 %
Platelets: 252 10*3/uL (ref 150–400)
RBC: 4.91 MIL/uL (ref 4.22–5.81)
RDW: 12.2 % (ref 11.5–15.5)
WBC: 8.6 10*3/uL (ref 4.0–10.5)
nRBC: 0 % (ref 0.0–0.2)

## 2020-11-19 LAB — URINALYSIS, ROUTINE W REFLEX MICROSCOPIC
Bilirubin Urine: NEGATIVE
Glucose, UA: NEGATIVE mg/dL
Hgb urine dipstick: NEGATIVE
Ketones, ur: NEGATIVE mg/dL
Leukocytes,Ua: NEGATIVE
Nitrite: NEGATIVE
Protein, ur: NEGATIVE mg/dL
Specific Gravity, Urine: 1.011 (ref 1.005–1.030)
pH: 6 (ref 5.0–8.0)

## 2020-11-19 LAB — CBG MONITORING, ED: Glucose-Capillary: 182 mg/dL — ABNORMAL HIGH (ref 70–99)

## 2020-11-19 LAB — SARS CORONAVIRUS 2 (TAT 6-24 HRS): SARS Coronavirus 2: NEGATIVE

## 2020-11-19 LAB — GLUCOSE, CAPILLARY: Glucose-Capillary: 192 mg/dL — ABNORMAL HIGH (ref 70–99)

## 2020-11-19 LAB — LIPASE, BLOOD: Lipase: 120 U/L — ABNORMAL HIGH (ref 11–51)

## 2020-11-19 MED ORDER — MORPHINE SULFATE (PF) 4 MG/ML IV SOLN
4.0000 mg | Freq: Once | INTRAVENOUS | Status: AC
  Administered 2020-11-19: 4 mg via INTRAVENOUS
  Filled 2020-11-19: qty 1

## 2020-11-19 MED ORDER — MORPHINE SULFATE (PF) 4 MG/ML IV SOLN
4.0000 mg | Freq: Once | INTRAVENOUS | Status: AC
Start: 2020-11-19 — End: 2020-11-19
  Administered 2020-11-19: 4 mg via INTRAVENOUS
  Filled 2020-11-19: qty 1

## 2020-11-19 MED ORDER — ENOXAPARIN SODIUM 40 MG/0.4ML ~~LOC~~ SOLN
40.0000 mg | SUBCUTANEOUS | Status: DC
  Administered 2020-11-20 (×2): 40 mg via SUBCUTANEOUS
  Filled 2020-11-19 (×3): qty 0.4

## 2020-11-19 MED ORDER — LISINOPRIL 10 MG PO TABS: 10.0000 mg | ORAL_TABLET | Freq: Every day | ORAL | Status: DC

## 2020-11-19 MED ORDER — ASPIRIN 81 MG PO CHEW
81.0000 mg | CHEWABLE_TABLET | Freq: Every day | ORAL | Status: DC
  Administered 2020-11-19 – 2020-11-21 (×3): 81 mg via ORAL
  Filled 2020-11-19 (×3): qty 1

## 2020-11-19 MED ORDER — HYDROMORPHONE HCL 1 MG/ML IJ SOLN
0.5000 mg | INTRAMUSCULAR | Status: DC | PRN
  Administered 2020-11-19 – 2020-11-21 (×9): 0.5 mg via INTRAVENOUS
  Filled 2020-11-19 (×11): qty 1

## 2020-11-19 MED ORDER — ATORVASTATIN CALCIUM 40 MG PO TABS: 40.0000 mg | ORAL_TABLET | Freq: Every day | ORAL | Status: DC

## 2020-11-19 MED ORDER — KETOROLAC TROMETHAMINE 15 MG/ML IJ SOLN
15.0000 mg | Freq: Three times a day (TID) | INTRAMUSCULAR | Status: DC | PRN
  Administered 2020-11-19 – 2020-11-21 (×3): 15 mg via INTRAVENOUS
  Filled 2020-11-19 (×3): qty 1

## 2020-11-19 MED ORDER — METHYLPHENIDATE HCL ER (OSM) 27 MG PO TBCR
27.0000 mg | EXTENDED_RELEASE_TABLET | Freq: Every day | ORAL | Status: DC
  Administered 2020-11-20 – 2020-11-21 (×2): 27 mg via ORAL
  Filled 2020-11-19 (×2): qty 1

## 2020-11-19 MED ORDER — SODIUM CHLORIDE 0.9 % IV SOLN: INTRAVENOUS | Status: DC

## 2020-11-19 MED ORDER — IOHEXOL 300 MG/ML  SOLN
100.0000 mL | Freq: Once | INTRAMUSCULAR | Status: AC | PRN
  Administered 2020-11-19: 100 mL via INTRAVENOUS

## 2020-11-19 MED ORDER — INSULIN ASPART 100 UNIT/ML ~~LOC~~ SOLN: 0.0000 [IU] | Freq: Every day | SUBCUTANEOUS | Status: DC

## 2020-11-19 MED ORDER — TAMSULOSIN HCL 0.4 MG PO CAPS
0.4000 mg | ORAL_CAPSULE | Freq: Every day | ORAL | Status: DC
  Administered 2020-11-19 – 2020-11-21 (×3): 0.4 mg via ORAL
  Filled 2020-11-19 (×3): qty 1

## 2020-11-19 MED ORDER — INSULIN ASPART 100 UNIT/ML ~~LOC~~ SOLN
0.0000 [IU] | Freq: Three times a day (TID) | SUBCUTANEOUS | Status: DC
  Administered 2020-11-20: 5 [IU] via SUBCUTANEOUS
  Administered 2020-11-20 – 2020-11-21 (×2): 2 [IU] via SUBCUTANEOUS

## 2020-11-19 MED ORDER — SODIUM CHLORIDE 0.9 % IV BOLUS
1000.0000 mL | Freq: Once | INTRAVENOUS | Status: AC
  Administered 2020-11-19: 1000 mL via INTRAVENOUS

## 2020-11-19 NOTE — ED Provider Notes (Signed)
[facility] Provider Note   CSN: [id number] Arrival date & time: 11/19/20  1011     History No chief complaint on file.   Frederick Morales is a 65 y.o. male with history of diabetes type 2, hypertension, pancreatitis, hepatitis C, status post appendectomy.  Patient presents with a chief complaint of abdominal pain.  Patient reports that abdominal pain is across his upper abdomen.  Patient states pain began Saturday evening.  Patient reports that pain was gradual in onset.  Pain has gotten progressively worse over the last 3 days.  Patient dates pain is constant.  Patient rates pain 6/10 on the pain scale.  Patient denies any radiation of his pain.  Patient reports pain is worse with movement and any p.o. intake.  Patient denies any alleviating factors.      Patient reports that his last bowel movement was earlier today.  Patient denied any diarrhea, blood in stool, rectal bleeding, or melena.  Patient states that Saturday he had tacos with raw cabbage on them.  Patient believes this may be the inciting incident for his abdominal pain as pain gradually began after completing this meal.  Patient denies any fevers, chills, rhinorrhea, nasal congestion, sore throat, cough, shortness of breath, chest pain, nausea, vomiting, abdominal distention, blood in stool, diarrhea, constipation, dysuria, hematuria, urinary frequency.    Patient states that he has not had any diverticulitis flares since 2020.  Per chart review patient was seen in the emergency department at [facility] 03/19/2019 for this incident.  Patient followed up with Dr. Carron Brazen with [facility] gastroenterology 06/2019.  Patient reports has not seen a gastroenterologist since this time.    Patient denies any alcohol use, tobacco use, or drug use.  Patient reports he has not had sexual intercourse in the last year.  Reports that he took his Metformin and Victoza this morning.  HPI     Past Medical  History:  Diagnosis Date  . Acute pancreatitis 06/21/2015  . Arthritis   . Blood transfusion   . Diabetes mellitus    TYPE 2  . Hepatitis C    Hep C, [place] and cleared in Jun 2016  . Hypertension   . MRSA (methicillin resistant Staphylococcus aureus)     Patient Active Problem List   Diagnosis Date Noted  . DM (diabetes mellitus) type II controlled, neurological manifestation ([place]) 06/21/2015  . Essential hypertension 06/21/2015  . Acute pancreatitis 06/21/2015  . Pancreatitis 06/20/2015  . MRSA (methicillin resistant Staphylococcus aureus)   . Hepatitis C     Past Surgical History:  Procedure Laterality Date  . APPENDECTOMY    . DENTAL SURGERY    . TONSILLECTOMY         Family History  Problem Relation Age of Onset  . Diabetes Sister   . Tuberculosis Maternal Grandmother     Social History   Tobacco Use  . Smoking status: Former Research scientist (life sciences)  . Smokeless tobacco: Never Used  . Tobacco comment: quit smoking in the 1980"s  Vaping Use  . Vaping Use: Never used  Substance Use Topics  . Alcohol use: No    Comment: Quit 30 years ago  . Drug use: No    Home Medications Prior to Admission medications   Medication Sig Start Date End Date Taking? Authorizing Provider  Apple Cider Vinegar 500 MG TABS Take by mouth.    [provider]  aspirin 81 MG chewable tablet Chew 81 mg by mouth daily. 04/21/12  [provider]  atorvastatin (LIPITOR) 40 MG tablet Take 40 mg by mouth daily.    [provider]  cholecalciferol (VITAMIN D3) 25 MCG (1000 UNIT) tablet Take 1,000 Units by mouth daily.    [provider]  CONCERTA 27 MG CR tablet Take 27 mg by mouth every morning. 09/25/20   [provider]  Flaxseed, Linseed, (FLAXSEED OIL) 1000 MG CAPS Take 1,000 mg by mouth daily.    [provider]  Investigational - Study Medication Apply 1 application topically daily. Study name: Additional study details: Testosterone Cream     [provider]  liraglutide (VICTOZA) 18 MG/3ML SOPN Inject into the skin every morning. 0.6 mL    [provider]  lisinopril (ZESTRIL) 10 MG tablet Take 10 mg by mouth daily.    [provider]  metFORMIN (GLUCOPHAGE) 1000 MG tablet Take 1,000 mg by mouth 2 (two) times daily with a meal.    [provider]  Multiple Vitamins-Minerals (MULTIVITAMIN WITH MINERALS) tablet Take 1 tablet by mouth daily.    [provider]  tamsulosin (FLOMAX) 0.4 MG CAPS capsule Take 0.4 mg by mouth daily.    [provider]  vitamin C (ASCORBIC ACID) 500 MG tablet Take 500 mg by mouth daily.    [provider]    Allergies    Penicillins  Review of Systems   Review of Systems  Constitutional: Negative for chills and fever.  HENT: Negative for congestion, rhinorrhea and sore throat.   Eyes: Negative for visual disturbance.  Respiratory: Negative for cough and shortness of breath.   Cardiovascular: Negative for chest pain.  Gastrointestinal: Positive for abdominal pain. Negative for abdominal distention, anal bleeding, blood in stool, constipation, diarrhea, nausea, rectal pain and vomiting.  Genitourinary: Negative for difficulty urinating, dysuria, frequency and hematuria.  Musculoskeletal: Negative for back pain and neck pain.  Skin: Negative for color change and rash.  Neurological: Negative for dizziness, syncope, light-headedness and headaches.  Psychiatric/Behavioral: Negative for confusion.    Physical Exam Updated Vital Signs BP (!) 147/89 (BP Location: Right Arm)   Pulse 85   Temp 98.1 F (36.7 C) (Oral)   Resp (!) 21   SpO2 98%   Physical Exam Vitals and nursing note reviewed.  Constitutional:      General: He is not in acute distress.    Appearance: He is not toxic-appearing or diaphoretic.     Comments: Appears uncomfortable due to reports of abdominal pain  HENT:     Head: Normocephalic.  Eyes:     General: No  scleral icterus.       Right eye: No discharge.        Left eye: No discharge.  Cardiovascular:     Rate and Rhythm: Normal rate.     Heart sounds: Murmur heard.    Pulmonary:     Effort: Pulmonary effort is normal. No tachypnea, bradypnea or respiratory distress.     Breath sounds: Normal breath sounds. No stridor. No wheezing, rhonchi or rales.     Comments: Able to speak in full complete sentences without difficulty Abdominal:     General: Abdomen is flat. A surgical scar is present. There is no distension. There are no signs of injury.     Palpations: Abdomen is soft. There is no mass or pulsatile mass.     Tenderness: There is abdominal tenderness in the right upper quadrant and left upper quadrant. There is no right CVA tenderness, left CVA tenderness,  guarding or rebound. Positive signs include Murphy's sign.     Hernia: A hernia is present. Hernia is present in the umbilical area. There is no hernia in the ventral area.     Comments: Umbilical hernia easily reducible, no tenderness to umbilical hernia  Musculoskeletal:     Cervical back: Normal range of motion and neck supple.     Right lower leg: No swelling, deformity, lacerations, tenderness or bony tenderness. No edema.     Left lower leg: No swelling, deformity, lacerations, tenderness or bony tenderness. No edema.  Skin:    General: Skin is warm and dry.     Coloration: Skin is not jaundiced or pale.  Neurological:     General: No focal deficit present.     Mental Status: He is alert.  Psychiatric:        Behavior: Behavior is cooperative.     ED Results / Procedures / Treatments   Labs (all labs ordered are listed, but only abnormal results are displayed) Labs Reviewed  COMPREHENSIVE METABOLIC PANEL - Abnormal; Notable for the following components:      Result Value   Sodium 132 (*)    Glucose, Bld 190 (*)    All other components within normal limits  LIPASE, BLOOD - Abnormal; Notable for the following  components:   Lipase 120 (*)    All other components within normal limits  CBG MONITORING, ED - Abnormal; Notable for the following components:   Glucose-Capillary 182 (*)    All other components within normal limits  SARS CORONAVIRUS 2 (TAT 6-24 HRS)  CBC WITH DIFFERENTIAL/PLATELET  URINALYSIS, ROUTINE W REFLEX MICROSCOPIC    EKG None  Radiology CT Abdomen Pelvis W Contrast  Result Date: 11/19/2020 CLINICAL DATA:  Right upper abdominal epigastric pain EXAM: CT ABDOMEN AND PELVIS WITH CONTRAST TECHNIQUE: Multidetector CT imaging of the abdomen and pelvis was performed using the standard protocol following bolus administration of intravenous contrast. CONTRAST:  124m OMNIPAQUE IOHEXOL 300 MG/ML  SOLN COMPARISON:  June 20, 2015 FINDINGS: Lower chest: The visualized heart size within normal limits. No pericardial fluid/thickening. No hiatal hernia. The visualized portions of the lungs are clear. Hepatobiliary: The liver is normal in density without focal abnormality.The main portal vein is patent. No evidence of calcified gallstones, gallbladder wall thickening or biliary dilatation. Pancreas: Inflammatory changes are seen around the pancreatic head and uncinate process with extension into the retroperitoneum. There is diffuse pancreatic head edema with mild pancreatic ductal dilatation measuring up to 4 mm. No definite stone is noted. No loculated fluid collections or pancreatic necrosis is seen. Spleen: Normal in size without focal abnormality. Adrenals/Urinary Tract: Both adrenal glands appear normal. The kidneys and collecting system appear normal without evidence of urinary tract calculus or hydronephrosis. Bladder is unremarkable. Stomach/Bowel: The stomach is unremarkable. There is inflammatory changes seen surrounding the duodenum. The remainder of the small bowel and colon are unremarkable. There is scattered colonic diverticula. Vascular/Lymphatic: There are no enlarged mesenteric,  retroperitoneal, or pelvic lymph nodes. Scattered aortic atherosclerotic calcifications are seen without aneurysmal dilatation. Reproductive: The prostate is unremarkable. Other: No evidence of abdominal wall mass or hernia. Musculoskeletal: No acute or significant osseous findings. Degenerative changes are seen in the lower lumbar spine. IMPRESSION: Findings suggestive of acute pancreatitis involving the pancreatic head and uncinate process. No adjacent pseudocyst or evidence of pancreatic necrosis Inflammatory changes surrounding the duodenum. Diverticulosis without diverticulitis Aortic Atherosclerosis (ICD10-I70.0). Electronically Signed   By: BPrudencio PairM.D.   On:  11/19/2020 14:45    Procedures Procedures   Medications Ordered in ED Medications  morphine 4 MG/ML injection 4 mg (4 mg Intravenous Given 11/19/20 1109)  morphine 4 MG/ML injection 4 mg (4 mg Intravenous Given 11/19/20 1331)  iohexol (OMNIPAQUE) 300 MG/ML solution 100 mL (100 mLs Intravenous Contrast Given 11/19/20 1431)    ED Course  I have reviewed the triage vital signs and the nursing notes.  Pertinent labs & imaging results that were available during my care of the patient were reviewed by me and considered in my medical decision making (see chart for details).  Clinical Course as of 11/19/20 1531  Tue Nov 19, 2020  1237 Lymphocyte #: [id number] [PB]    Clinical Course User Index [PB] Dyann Ruddle   MDM Rules/Calculators/A&P                          Alert 65 year old male in no acute distress, nontoxic-appearing.  Patient appears uncomfortable due to complaints of upper abdominal pain.  Patient denies any radiation of his pain.  Pain is worse with any p.o. intake.  Patient denies any associated nausea or vomiting.  Patient has history of pancreatitis and diverticulitis.  On physical exam normoactive bowel sounds, abdomen soft, nondistended, tenderness to upper left,  upper right quadrant and epigastrium.   Positive Murphy sign.  Negative CVA tenderness.  POC CBG 182. CMP shows AST, ALT, alk phos, total bili all within normal limits; low suspicion for hepatoma biliary disease. CMP shows glucose elevated at 190, bicarb and anion gap within normal limits; low suspicion for DKA. CBC unremarkable. Urinalysis unremarkable, patient denies any urinary frequency, dysuria, hematuria or flank pain, CVA tenderness negative; low suspicion for UTI, pyelonephritis, or renal calculi  Lipase elevated at 120 concern for acute pancreatitis.  Will obtain contrast CT scan of abdomen and pelvis.  CT scan shows findings suggestive of acute pancreatitis involving the pancreatic head and uncinate process.  No adjacent pseudocyst or evidence of pancreatic necrosis.  Inflammatory changes surrounding the duodenum.  Diverticulosis without diverticulitis.  Patient received 1 L fluid bolus, 4 mg morphine x2.  On serial reexamination patient's abdomen remains soft, nondistended, tenderness to upper quadrant as well as epigastric area.  Patient denies any improvement in his pain despite 2 rounds of pain medication.  Concerned that patient will not be able to manage pain in outpatient setting.  Will consult hospitalist for admission.  Patient is amenable with this plan.   15:45 spoke with Dr. Precious Haws who agreed to see the patient for admission.  He is agreeable with this plan.  Final Clinical Impression(s) / ED Diagnoses Final diagnoses:  Acute pancreatitis, unspecified complication status, unspecified pancreatitis type  Pancreatitis    Rx / DC Orders ED Discharge Orders    None       Dyann Ruddle 11/19/20 Einar Crow    Elnora Morrison, MD 11/21/20 1506

## 2020-11-19 NOTE — H&P (Addendum)
Family Medicine Teaching [facility] Admission History and Physical Service Pager: [phone]  Patient name: Frederick Morales Medical record number: [id number] Date of birth: 1955-11-28 Age: 65 y.o. Gender: male  Primary Care Provider: Armando GangLindley, Cheryl P, FNP Consultants: None Code Status: Full code Preferred Emergency Contact: Buren Kosngie Leonard, sister, [phone]  Chief Complaint: Abdominal Pain  Assessment and Plan: Frederick MonksRobert J Illes is a 65 y.o. male presenting with abdominal pain with acute pancreatitis . PMH is significant for previous pancreatitis, HTN, T2DM, Hep C (treated).  Abdominal Pain 2/2 Acute Pancreatitis Vitals overall unremarkable in ED. ED work-up notable for lipase elevated at 120, Na 132. CBC and UA unremarkable.  CT abdomen pelvis suggestive of acute pancreatitis involving the pancreatic head and uncinate process.  No adjacent pseudocyst or evidence of proptotic necrosis. Denies recent or current EtOH, though was a heavy drinker in his early adulthood. No known gallbladder issues. Previously had pancreatitis about 6 years ago that was thought to be caused by hepatitis C treatment. No recent changes in his medications. No recent illnesses though patient notes he incidentally tested positive for COVID-19 about 2-3 weeks ago when he was getting pre-op screening for his right shoulder repair. Question if recent COVID infection may be contributing to this picture. Last lipid panel in 2016 and TG were within normal range.Will need to exclude gallbladder and TG as etiologies. Patient has been on victoza, recently stopped as he ran out. GLP-1s like liraglutide are associated with pancreatitis. Atorvastatin, Metformin and Lisinopril are listed as a Class III drug that can cause acute pancreatitis, question if this can occur even after prolonged use of medication. No acute trauma and not hypercalcemic. Could be idiopathic. Will admit patient for bowel rest and pain control.  -Admit  to med-surg FPTS, attending Dr. Lum BabeEniola -Vitals per floor protocol  -0.5 mg Dilaudid q1h PRN breakthrough pain -Toradol 15 mg q8h PRN mild to moderate pain  -CLD and ADAT -IVF with NS at 125 mL/hr -Lipid panel -RUQ U/S  -Lovenox for VTE ppx  -Up with assistance  HTN: chronic, stable On Lisinopril 10 mg daily.  -Vitals per floor protocol -Hold Lisinopril daily   Type 2 DM: chronic, stable On Metformin 1000 mg twice daily, prescribed victoza but recently ran out. -CBG AC/BID -Hold Metformin and Victoza -SSI  -Hgb A1c  ADHD: chronic, stable On concerta 27 mg.  -Continue Concerta daily   BPH: chronic, stable On Flomax 0.4 mg daily. -Continue Flomax  Hepatitis C- treated Patient reports that he was treated in 2016 with harvoni and cleared.  FEN/GI: Clear liquid diet, ADAT Prophylaxis: Lovenox  Disposition: Med-surg  History of Present Illness:  Frederick MonksRobert J Kemp is a 65 y.o. male presenting with abdominal pain c/f pancreatitis. On Saturday night patient ate a lot of tacos and felt abdominal pain afterward and fullness, but it continued to get worse. He has had abdominal pain with nausea and pain with eating and pain without eating. It became unbearable today and he came for evaluation. He describes the pain as a constant burning pain diffusely across his whole abdomen. 6/10 pain, minimally improves with pain medication but comes back quickly. He took his metformin, but was unable to take his HTN medication.  Patient had idiopathic pancreatitis about 6 years ago. He reports that he was a heavy drinker from ages 65 yo to 65 yo, he has not had a drink EtOH since age 65. He smoked 3 ppd x15 years, has not had a cigarette since age 65.  He had hep C, has been treated; he reports that he was told tha there was a theory that he got pancreatitis from his hep c medications.  He reports that he is not sure how he got Hep C, but he had been dating a girl during his drinking years, who he later  found out passed away from Hep C. He had 2nd episode of COVID 3-4 weeks ago, he also got it in summer of 2020. He has had 2 vaccines for COVID. He has shoulder surgery for bicep repair on hold because of recent COVID. He was previously on victoza for DM, but takes metformin daily. He reports losing a significant amount of weight with GLP1 use recently.  ED Course: Given Morphine 4mg  x2. Labs with elevated lipase and hyponatremia. CT abdomen with evidence of acute pancreatitis. FPTS paged for unassigned admission.   Review Of Systems: Per HPI with the following additions:   Review of Systems  Constitutional: Positive for appetite change. Negative for activity change and fever.  Respiratory: Negative for chest tightness and shortness of breath.   Cardiovascular: Negative for chest pain.  Gastrointestinal: Positive for abdominal pain and nausea. Negative for abdominal distention and constipation.  Neurological: Negative for dizziness, weakness, light-headedness, numbness and headaches.     Patient Active Problem List   Diagnosis Date Noted  . DM (diabetes mellitus) type II controlled, neurological manifestation (HCC) 06/21/2015  . Essential hypertension 06/21/2015  . Acute pancreatitis 06/21/2015  . Pancreatitis 06/20/2015  . MRSA (methicillin resistant Staphylococcus aureus)   . Hepatitis C     Past Medical History: Past Medical History:  Diagnosis Date  . Acute pancreatitis 06/21/2015  . Arthritis   . Blood transfusion   . Diabetes mellitus    TYPE 2  . Hepatitis C    Hep C, Harvoni and cleared in Jun 2016  . Hypertension   . MRSA (methicillin resistant Staphylococcus aureus)     Past Surgical History: Past Surgical History:  Procedure Laterality Date  . APPENDECTOMY    . DENTAL SURGERY    . TONSILLECTOMY      Social History: Social History   Tobacco Use  . Smoking status: Former Jul 2016  . Smokeless tobacco: Never Used  . Tobacco comment: quit smoking in the 1980"s   Vaping Use  . Vaping Use: Never used  Substance Use Topics  . Alcohol use: No    Comment: Quit 30 years ago  . Drug use: No   Additional social history: patient is primary caregiver for his mother who has dementia. His mother is staying with his sister right now.  Please also refer to relevant sections of EMR.  Family History: Family History  Problem Relation Age of Onset  . Diabetes Sister   . Tuberculosis Maternal Grandmother     Allergies and Medications: Allergies  Allergen Reactions  . Penicillins Swelling    Facial swelling Has patient had a PCN reaction causing immediate rash, facial/tongue/throat swelling, SOB or lightheadedness with hypotension: No Haspatient had a PCN reaction causing severe rash involving mucus membranes or skin necrosis/No Has patient had a PCN reaction that required hospitalization /No Has patient had a PCN reaction occurring within the last 10 years: No If all of the above answers are "NO", then may proceed with Cephalosporin use.    No current facility-administered medications on file prior to encounter.   Current Outpatient Medications on File Prior to Encounter  Medication Sig Dispense Refill  . Apple Cider Vinegar 500 MG TABS  Take by mouth.    Marland Kitchen aspirin 81 MG chewable tablet Chew 81 mg by mouth daily.    Marland Kitchen atorvastatin (LIPITOR) 40 MG tablet Take 40 mg by mouth daily.    . cholecalciferol (VITAMIN D3) 25 MCG (1000 UNIT) tablet Take 1,000 Units by mouth daily.    . CONCERTA 27 MG CR tablet Take 27 mg by mouth every morning.    . Flaxseed, Linseed, (FLAXSEED OIL) 1000 MG CAPS Take 1,000 mg by mouth daily.    . Investigational - Study Medication Apply 1 application topically daily. Study name: Additional study details: Testosterone Cream    . liraglutide (VICTOZA) 18 MG/3ML SOPN Inject into the skin every morning. 0.6 mL    . lisinopril (ZESTRIL) 10 MG tablet Take 10 mg by mouth daily.    . metFORMIN (GLUCOPHAGE) 1000 MG tablet Take 1,000 mg  by mouth 2 (two) times daily with a meal.    . Multiple Vitamins-Minerals (MULTIVITAMIN WITH MINERALS) tablet Take 1 tablet by mouth daily.    . tamsulosin (FLOMAX) 0.4 MG CAPS capsule Take 0.4 mg by mouth daily.    . vitamin C (ASCORBIC ACID) 500 MG tablet Take 500 mg by mouth daily.      Objective: BP 127/87 (BP Location: Right Arm)   Pulse 87   Temp 98.1 F (36.7 C) (Oral)   Resp 17   SpO2 99%  Exam: General: Awake, alert, in no distress  Eyes: No scleral icterus  Neck: Supple Cardiovascular: RRR without murmur Respiratory: CTAB without wheezing/rhonchi/rales Gastrointestinal: soft, tenderness to palpation of entire upper abdomen, worse in epigastric region, non-distended, no rebound or guarding MSK: Moving all extremities, without obvious deformity Derm: Warm and dry, no edema Neuro: No focal deficits  Psych: Mood appropriate  Labs and Imaging: CBC BMET  Recent Labs  Lab 11/19/20 1035  WBC 8.6  HGB 14.8  HCT 42.8  PLT 252   Recent Labs  Lab 11/19/20 1035  NA 132*  K 3.7  CL 99  CO2 24  BUN 10  CREATININE 0.72  GLUCOSE 190*  CALCIUM 9.4     EKG: Sinus rhythm rate 64 with 1st degree AV block and RBB (which is not new and can be seen in 2016 EKG)  CT Abdomen Pelvis W Contrast  Result Date: 11/19/2020 CLINICAL DATA:  Right upper abdominal epigastric pain EXAM: CT ABDOMEN AND PELVIS WITH CONTRAST TECHNIQUE: Multidetector CT imaging of the abdomen and pelvis was performed using the standard protocol following bolus administration of intravenous contrast. CONTRAST:  OMNIPAQUE IOHEXOL 300 MG/ML  SOLN COMPARISON:  June 20, 2015 FINDINGS: Lower chest: The visualized heart size within normal limits. No pericardial fluid/thickening. No hiatal hernia. The visualized portions of the lungs are clear. Hepatobiliary: The liver is normal in density without focal abnormality.The main portal vein is patent. No evidence of calcified gallstones, gallbladder wall thickening  or biliary dilatation. Pancreas: Inflammatory changes are seen around the pancreatic head and uncinate process with extension into the retroperitoneum. There is diffuse pancreatic head edema with mild pancreatic ductal dilatation measuring up to 4 mm. No definite stone is noted. No loculated fluid collections or pancreatic necrosis is seen. Spleen: Normal in size without focal abnormality. Adrenals/Urinary Tract: Both adrenal glands appear normal. The kidneys and collecting system appear normal without evidence of urinary tract calculus or hydronephrosis. Bladder is unremarkable. Stomach/Bowel: The stomach is unremarkable. There is inflammatory changes seen surrounding the duodenum. The remainder of the small bowel and colon are unremarkable. There  is scattered colonic diverticula. Vascular/Lymphatic: There are no enlarged mesenteric, retroperitoneal, or pelvic lymph nodes. Scattered aortic atherosclerotic calcifications are seen without aneurysmal dilatation. Reproductive: The prostate is unremarkable. Other: No evidence of abdominal wall mass or hernia. Musculoskeletal: No acute or significant osseous findings. Degenerative changes are seen in the lower lumbar spine. IMPRESSION: Findings suggestive of acute pancreatitis involving the pancreatic head and uncinate process. No adjacent pseudocyst or evidence of pancreatic necrosis Inflammatory changes surrounding the duodenum. Diverticulosis without diverticulitis Aortic Atherosclerosis (ICD10-I70.0). Electronically Signed   By: Jonna Clark M.D.   On: 11/19/2020 14:45     Sabino Dick, DO 11/19/2020, 3:39 PM PGY-1, [facility] FPTS Intern pager: [phone], text pages welcome  FPTS Upper-Level Resident Addendum   I have independently interviewed and examined the patient. I have discussed the above with the original author and agree with their documentation. My edits for correction/addition/clarification are in pink. Please see also any  attending notes.   Shirlean Mylar, M.D. PGY-2, [facility] 11/19/2020 6:21 PM  FPTS Service pager: [phone] (text pages welcome through [facility])

## 2020-11-19 NOTE — ED Notes (Signed)
Pt transported to US

## 2020-11-19 NOTE — ED Triage Notes (Signed)
Pt here from home with c/o abd pain mostly right upper radiating across the abd , started sat night and has gotten worse over the last 2 days , no n/v/d

## 2020-11-19 NOTE — Hospital Course (Addendum)
Frederick Morales is a 65 y.o. male who presented with abdominal pain, found to have acute pancreatitis . PMH is significant for previous pancreatitis, HTN, T2DM, Hep C (treated). Below is his hospital course listed by problem. Refer to the H&P for additional information.  Acute Pancreatitis Lipase 120. CT Abd/pelv suggestive of acute pancreatitis involving the pancreatitic head and uncinate process without pseudocyst or necrosis. Given 4mg  Morphine x2 in ED. Pain control on admission with Toradol and Dilaudid. Patient started on clear liquid diet and advanced as tolerated. Metformin, Lisinopril, Atorvastatin and Victoza held during admission (and discontinued at discharge) as these could cause pancreatitis. Lipid panel with decreased HDL but otherwise unremarkable. RUQ U/S negative. Patient was able to tolerate diet with improved pain on d/c. Switched to oral pain regimen with scheduled Ibuprofen and 5 mg Oxy IR PRN for breakthrough pain on d/c.    Other problems chronic and stable.   Follow Up: Victoza stopped on d/c due to acute concern it may have contributed to acute pancreatitis Reasses diabetes mellitus management outpatient since Victoza d/c. Avoid GLP-1.

## 2020-11-20 DIAGNOSIS — K5790 Diverticulosis of intestine, part unspecified, without perforation or abscess without bleeding: Secondary | ICD-10-CM | POA: Diagnosis present

## 2020-11-20 DIAGNOSIS — Z9049 Acquired absence of other specified parts of digestive tract: Secondary | ICD-10-CM | POA: Diagnosis not present

## 2020-11-20 DIAGNOSIS — Z79899 Other long term (current) drug therapy: Secondary | ICD-10-CM | POA: Diagnosis not present

## 2020-11-20 DIAGNOSIS — Z833 Family history of diabetes mellitus: Secondary | ICD-10-CM | POA: Diagnosis not present

## 2020-11-20 DIAGNOSIS — R748 Abnormal levels of other serum enzymes: Secondary | ICD-10-CM | POA: Diagnosis present

## 2020-11-20 DIAGNOSIS — I10 Essential (primary) hypertension: Secondary | ICD-10-CM | POA: Diagnosis present

## 2020-11-20 DIAGNOSIS — B1920 Unspecified viral hepatitis C without hepatic coma: Secondary | ICD-10-CM | POA: Diagnosis present

## 2020-11-20 DIAGNOSIS — Z8616 Personal history of COVID-19: Secondary | ICD-10-CM | POA: Diagnosis not present

## 2020-11-20 DIAGNOSIS — Z8614 Personal history of Methicillin resistant Staphylococcus aureus infection: Secondary | ICD-10-CM | POA: Diagnosis not present

## 2020-11-20 DIAGNOSIS — E1165 Type 2 diabetes mellitus with hyperglycemia: Secondary | ICD-10-CM | POA: Diagnosis not present

## 2020-11-20 DIAGNOSIS — F909 Attention-deficit hyperactivity disorder, unspecified type: Secondary | ICD-10-CM | POA: Diagnosis present

## 2020-11-20 DIAGNOSIS — E119 Type 2 diabetes mellitus without complications: Secondary | ICD-10-CM | POA: Diagnosis present

## 2020-11-20 DIAGNOSIS — Z880 Allergy status to penicillin: Secondary | ICD-10-CM | POA: Diagnosis not present

## 2020-11-20 DIAGNOSIS — E871 Hypo-osmolality and hyponatremia: Secondary | ICD-10-CM | POA: Diagnosis present

## 2020-11-20 DIAGNOSIS — R109 Unspecified abdominal pain: Secondary | ICD-10-CM | POA: Diagnosis present

## 2020-11-20 DIAGNOSIS — Z7984 Long term (current) use of oral hypoglycemic drugs: Secondary | ICD-10-CM | POA: Diagnosis not present

## 2020-11-20 DIAGNOSIS — Z87891 Personal history of nicotine dependence: Secondary | ICD-10-CM | POA: Diagnosis not present

## 2020-11-20 DIAGNOSIS — K859 Acute pancreatitis without necrosis or infection, unspecified: Secondary | ICD-10-CM | POA: Diagnosis present

## 2020-11-20 DIAGNOSIS — N400 Enlarged prostate without lower urinary tract symptoms: Secondary | ICD-10-CM | POA: Diagnosis present

## 2020-11-20 DIAGNOSIS — Z7982 Long term (current) use of aspirin: Secondary | ICD-10-CM | POA: Diagnosis not present

## 2020-11-20 LAB — HIV ANTIBODY (ROUTINE TESTING W REFLEX): HIV Screen 4th Generation wRfx: NONREACTIVE

## 2020-11-20 LAB — LIPID PANEL
Cholesterol: 98 mg/dL (ref 0–200)
HDL: 25 mg/dL — ABNORMAL LOW (ref >40)
LDL Cholesterol: 46 mg/dL (ref 0–99)
Total CHOL/HDL Ratio: 3.9 RATIO
Triglycerides: 135 mg/dL (ref <150)
VLDL: 27 mg/dL (ref 0–40)

## 2020-11-20 LAB — GLUCOSE, CAPILLARY
Glucose-Capillary: 224 mg/dL — ABNORMAL HIGH (ref 70–99)
Glucose-Capillary: 126 mg/dL — ABNORMAL HIGH (ref 70–99)
Glucose-Capillary: 85 mg/dL (ref 70–99)
Glucose-Capillary: 180 mg/dL — ABNORMAL HIGH (ref 70–99)

## 2020-11-20 LAB — BASIC METABOLIC PANEL
Anion gap: 7 (ref 5–15)
BUN: 9 mg/dL (ref 8–23)
CO2: 24 mmol/L (ref 22–32)
Calcium: 8.5 mg/dL — ABNORMAL LOW (ref 8.9–10.3)
Chloride: 101 mmol/L (ref 98–111)
Creatinine, Ser: 0.64 mg/dL (ref 0.61–1.24)
GFR, Estimated: >60 mL/min (ref >60)
Glucose, Bld: 230 mg/dL — ABNORMAL HIGH (ref 70–99)
Potassium: 3.8 mmol/L (ref 3.5–5.1)
Sodium: 132 mmol/L — ABNORMAL LOW (ref 135–145)

## 2020-11-20 LAB — HEMOGLOBIN A1C
Hgb A1c MFr Bld: 7.3 % — ABNORMAL HIGH (ref 4.8–5.6)
Mean Plasma Glucose: 162.81 mg/dL

## 2020-11-20 NOTE — Progress Notes (Signed)
Family Medicine Teaching Service Daily Progress Note Intern Pager: [phone]  Patient name: Frederick Morales Medical record number: [id number] Date of birth: 1955/10/16 Age: 65 y.o. Gender: male  Primary Care Provider: Armando Gang, FNP Consultants: None Code Status: FULL  Pt Overview and Major Events to Date:  3/15: Admitted  Assessment and Plan: [name] is a 65 y.o. male who presented with abdominal pain, found to have acute pancreatitis . PMH is significant for previous pancreatitis, HTN, T2DM, Hep C (treated).  Acute Pancreatitis Overnight, received Toradol PRN x1 and Dilaudid PRN x3. Reports pain this AM is improved, medication is helping.  Describes pain as more burning now. Lipid panel with HDL 25, otherwise within normal range. Negative RUQ U/S. Victoza, Atorvastatin, Lisinopril and Metformin are being held currently given association with pancreatitis.  -Continue pain control with Toradol PRN and dilaudid PRN -Continue IVF  -Continue clear liquid diet, ADAT  Hypertension: chronic, stable Recent BP's are stable and overall normotensive. Most recent BP 109/74. -Holding home Lisinopril.  Type 2 DM: chronic, stable Hgb A1c 7.3. Random glucose this AM 230. CBG's 182-230. Has not received SSI thus far.  -mSSI  -Monitor CBG's  ADHD: chronic, stable On concerta 27 mg.  -Continue Concerta daily   BPH: chronic, stable On Flomax 0.4 mg daily. -Continue Flomax  Hepatitis C- treated Patient reports that he was treated in 2016 with harvoni and cleared.   FEN/GI: Clear liquid, ADAT PPx: Lovenox   Status is: Observation  The patient will require care spanning > 2 midnights and should be moved to inpatient because: Ongoing active pain requiring inpatient pain management and IV treatments appropriate due to intensity of illness or inability to take PO  Dispo: The patient is from: Home              Anticipated d/c is to: Home              Patient currently  is not medically stable to d/c.   Difficult to place patient No   Subjective:  Patient feels that pain medication is helping.  Once medication wears off he continues to have a burning pain in his abdomen.  He has been eating lots of chicken broth and drinking fluids.  No nausea or vomiting.  Objective: Temp:  [97.4 F (36.3 C)-98.1 F (36.7 C)] 97.4 F (36.3 C) (03/16 0426) Pulse Rate:  [70-109] 79 (03/16 0426) Resp:  [8-30] 18 (03/16 0426) BP: (109-158)/(74-125) 109/74 (03/16 0426) SpO2:  [95 %-100 %] 96 % (03/16 0426) Physical Exam: General: Awake, alert, pleasant, in no distress Cardiovascular: RRR without murmur Respiratory: CTAB without wheezing/rhonchi/rales Abdomen: soft, very mild tenderness to epigastric and LUQ without rebound or guarding, improved from yesterday's examination Extremities: No edema, 2+ radial and DP pulses  Laboratory: Recent Labs  Lab 11/19/20 1035  WBC 8.6  HGB 14.8  HCT 42.8  PLT 252   Recent Labs  Lab 11/19/20 1035 11/20/20 0433  NA 132* 132*  K 3.7 3.8  CL 99 101  CO2 24 24  BUN 10 9  CREATININE 0.72 0.64  CALCIUM 9.4 8.5*  PROT 7.2  --   BILITOT 1.2  --   ALKPHOS 52  --   ALT 22  --   AST 17  --   GLUCOSE 190* 230*    Imaging/Diagnostic Tests: RUQ U/S IMPRESSION: Negative examination  Sabino Dick, DO 11/20/2020, 6:01 AM PGY-1, [facility] FPTS Intern pager: [phone], text pages welcome

## 2020-11-20 NOTE — TOC Initial Note (Signed)
Transition of Care [facility]) - Initial/Assessment Note    Patient Details  Name: Frederick Morales MRN: [id number] Date of Birth: [birth year]  Transition of Care [facility]) CM/SW Contact:    Kermit Balo, RN Phone Number: 11/20/2020, 3:06 PM  Clinical Narrative:                 Patient lives at home with his mother that has dementia. He states he provides care for her. He has care arranged for her at home while hospitalized.  Pt denies needs for personal DME at home. He has no issues with transportation or home meds.  TOC following for any d/c needs.   Expected Discharge Plan: Home/Self Care Barriers to Discharge: Continued Medical Work up   Patient Goals and CMS Choice        Expected Discharge Plan and Services Expected Discharge Plan: Home/Self Care       Living arrangements for the past 2 months: Single Family Home                                      Prior Living Arrangements/Services Living arrangements for the past 2 months: Single Family Home Lives with:: Parents (mother with dementia) Patient language and need for interpreter reviewed:: Yes Do you feel safe going back to the place where you live?: Yes            Criminal Activity/Legal Involvement Pertinent to Current Situation/Hospitalization: No - Comment as needed  Activities of Daily Living Home Assistive Devices/Equipment: None ADL Screening (condition at time of admission) Patient's cognitive ability adequate to safely complete daily activities?: Yes Is the patient deaf or have difficulty hearing?: No Does the patient have difficulty seeing, even when wearing glasses/contacts?: No Does the patient have difficulty concentrating, remembering, or making decisions?: No Patient able to express need for assistance with ADLs?: Yes Does the patient have difficulty dressing or bathing?: No Independently performs ADLs?: Yes (appropriate for developmental age) Does the patient have difficulty walking or  climbing stairs?: No Weakness of Legs: None Weakness of Arms/Hands: None  Permission Sought/Granted                  Emotional Assessment Appearance:: Appears stated age Attitude/Demeanor/Rapport: Engaged Affect (typically observed): Accepting Orientation: : Oriented to Self,Oriented to Place,Oriented to  Time,Oriented to Situation   Psych Involvement: No (comment)  Admission diagnosis:  Pancreatitis [K85.90] Acute pancreatitis, unspecified complication status, unspecified pancreatitis type [K85.90] Patient Active Problem List   Diagnosis Date Noted  . Type 2 diabetes mellitus with hyperglycemia, without long-term current use of insulin (HCC)   . DM (diabetes mellitus) type II controlled, neurological manifestation (HCC) 06/21/2015  . Essential hypertension 06/21/2015  . Acute pancreatitis 06/21/2015  . Pancreatitis 06/20/2015  . MRSA (methicillin resistant Staphylococcus aureus)   . Hepatitis C    PCP:  Armando Gang, FNP Pharmacy:   [facility], [place] - [address] [address] Phone: [phone] Fax: [phone]  [facility] [address], [place] - [address] [place] [address] Phone: [phone] Fax: [phone]     Social Determinants of Health (SDOH) Interventions    Readmission Risk Interventions No flowsheet data found.

## 2020-11-20 NOTE — Progress Notes (Signed)
Inpatient Diabetes Program Recommendations  AACE/ADA: New Consensus Statement on Inpatient Glycemic Control (2015)  Target Ranges:  Prepandial:   less than 140 mg/dL      Peak postprandial:   less than 180 mg/dL (1-2 hours)      Critically ill patients:  140 - 180 mg/dL   Lab Results  Component Value Date   GLUCAP 224 (H) 11/20/2020   HGBA1C 7.3 (H) 11/20/2020    Review of Glycemic Control Results for Frederick Morales, Frederick Morales (MRN [id number]) as of 11/20/2020 11:06  Ref. Range 11/19/2020 10:58 11/19/2020 22:08 11/20/2020 08:17  Glucose-Capillary Latest Ref Range: 70 - 99 mg/dL 563 (H) 893 (H) 734 (H)   Diabetes history: DM 2 Outpatient Diabetes medications: Metformin 1000 mg bid, Victoza 0.6 mg daily Current orders for Inpatient glycemic control:  Novolog moderate tid with meals  Inpatient Diabetes Program Recommendations:   Consider adding Lantus 10 units daily.  Note that patient was prescribed Victoza prior to admit however according to MD, he had run out?  Note pancreatitis can be a side effect for this class of medications.   Thanks,  Beryl Meager, RN, BC-ADM Inpatient Diabetes Coordinator Pager [phone] (8a-5p)

## 2020-11-21 ENCOUNTER — Other Ambulatory Visit: Payer: Self-pay | Admitting: Family Medicine

## 2020-11-21 LAB — BASIC METABOLIC PANEL
Anion gap: 7 (ref 5–15)
BUN: 9 mg/dL (ref 8–23)
CO2: 28 mmol/L (ref 22–32)
Calcium: 9 mg/dL (ref 8.9–10.3)
Chloride: 102 mmol/L (ref 98–111)
Creatinine, Ser: 0.66 mg/dL (ref 0.61–1.24)
GFR, Estimated: >60 mL/min (ref >60)
Glucose, Bld: 121 mg/dL — ABNORMAL HIGH (ref 70–99)
Potassium: 4 mmol/L (ref 3.5–5.1)
Sodium: 137 mmol/L (ref 135–145)

## 2020-11-21 LAB — GLUCOSE, CAPILLARY
Glucose-Capillary: 116 mg/dL — ABNORMAL HIGH (ref 70–99)
Glucose-Capillary: 145 mg/dL — ABNORMAL HIGH (ref 70–99)

## 2020-11-21 MED ORDER — IBUPROFEN 600 MG PO TABS: 600.0000 mg | ORAL_TABLET | Freq: Four times a day (QID) | ORAL | 0 refills | Status: DC

## 2020-11-21 MED ORDER — IBUPROFEN 600 MG PO TABS: 600.0000 mg | ORAL_TABLET | Freq: Four times a day (QID) | ORAL | 0 refills | Status: DC | PRN

## 2020-11-21 MED ORDER — OXYCODONE HCL 5 MG PO TABS
5.0000 mg | ORAL_TABLET | ORAL | Status: DC | PRN
  Administered 2020-11-21: 5 mg via ORAL
  Filled 2020-11-21: qty 1

## 2020-11-21 MED ORDER — IBUPROFEN 600 MG PO TABS
600.0000 mg | ORAL_TABLET | Freq: Four times a day (QID) | ORAL | Status: DC
  Administered 2020-11-21 (×2): 600 mg via ORAL
  Filled 2020-11-21 (×2): qty 1

## 2020-11-21 MED ORDER — OXYCODONE HCL 5 MG PO TABS: 5.0000 mg | ORAL_TABLET | ORAL | 0 refills | Status: DC | PRN

## 2020-11-21 NOTE — Plan of Care (Signed)
  Problem: Nutritional: Goal: Ability to achieve adequate nutritional intake will improve Outcome: Progressing   Problem: Coping: Goal: Level of anxiety will decrease Outcome: Progressing   Problem: Pain Managment: Goal: General experience of comfort will improve Outcome: Progressing

## 2020-11-21 NOTE — Discharge Instructions (Addendum)
Mr Mico, Spark were admitted with pancreatitis which is when the pancreas is inflamed. We are unsure what caused this but it could be the diabetic medication you were taking before coming in called Victoza. STOP taking this medication. You can discuss restarting this with your PCP. Continue your other diabetic medication-Metformin.  We will send you home with a short course of ibuprofen and oxycodone for pain control.  Please follow up with your PCP in 1-2 days.  Best wishes,  Dr Melba Coon

## 2020-11-21 NOTE — Plan of Care (Signed)
Problem: Education: Goal: Knowledge of Pancreatitis treatment and prevention will improve 11/21/2020 1522 by Katrina Stack, RN Outcome: Adequate for Discharge 11/21/2020 1522 by Katrina Stack, RN Outcome: Adequate for Discharge   Problem: Nutritional: Goal: Ability to achieve adequate nutritional intake will improve 11/21/2020 1522 by Katrina Stack, RN Outcome: Adequate for Discharge 11/21/2020 1522 by Katrina Stack, RN Outcome: Adequate for Discharge 11/21/2020 0758 by Katrina Stack, RN Outcome: Progressing   Problem: Clinical Measurements: Goal: Complications related to the disease process, condition or treatment will be avoided or minimized 11/21/2020 1522 by Katrina Stack, RN Outcome: Adequate for Discharge 11/21/2020 1522 by Katrina Stack, RN Outcome: Adequate for Discharge   Problem: Education: Goal: Knowledge of General Education information will improve Description: Including pain rating scale, medication(s)/side effects and non-pharmacologic comfort measures 11/21/2020 1522 by Katrina Stack, RN Outcome: Adequate for Discharge 11/21/2020 1522 by Katrina Stack, RN Outcome: Adequate for Discharge   Problem: Health Behavior/Discharge Planning: Goal: Ability to manage health-related needs will improve 11/21/2020 1522 by Katrina Stack, RN Outcome: Adequate for Discharge 11/21/2020 1522 by Katrina Stack, RN Outcome: Adequate for Discharge   Problem: Clinical Measurements: Goal: Ability to maintain clinical measurements within normal limits will improve 11/21/2020 1522 by Katrina Stack, RN Outcome: Adequate for Discharge 11/21/2020 1522 by Katrina Stack, RN Outcome: Adequate for Discharge Goal: Will remain free from infection 11/21/2020 1522 by Katrina Stack, RN Outcome: Adequate for Discharge 11/21/2020 1522 by Katrina Stack, RN Outcome: Adequate for Discharge Goal: Diagnostic test results will improve 11/21/2020 1522  by Katrina Stack, RN Outcome: Adequate for Discharge 11/21/2020 1522 by Katrina Stack, RN Outcome: Adequate for Discharge   Problem: Nutrition: Goal: Adequate nutrition will be maintained 11/21/2020 1522 by Katrina Stack, RN Outcome: Adequate for Discharge 11/21/2020 1522 by Katrina Stack, RN Outcome: Adequate for Discharge   Problem: Coping: Goal: Level of anxiety will decrease 11/21/2020 1522 by Katrina Stack, RN Outcome: Adequate for Discharge 11/21/2020 1522 by Katrina Stack, RN Outcome: Adequate for Discharge 11/21/2020 0758 by Katrina Stack, RN Outcome: Progressing   Problem: Pain Managment: Goal: General experience of comfort will improve 11/21/2020 1522 by Katrina Stack, RN Outcome: Adequate for Discharge 11/21/2020 1522 by Katrina Stack, RN Outcome: Adequate for Discharge 11/21/2020 0758 by Katrina Stack, RN Outcome: Progressing   Problem: Education: Goal: Knowledge of Pancreatitis treatment and prevention will improve 11/21/2020 1522 by Katrina Stack, RN Outcome: Adequate for Discharge 11/21/2020 1522 by Katrina Stack, RN Outcome: Adequate for Discharge   Problem: Nutritional: Goal: Ability to achieve adequate nutritional intake will improve 11/21/2020 1522 by Katrina Stack, RN Outcome: Adequate for Discharge 11/21/2020 1522 by Katrina Stack, RN Outcome: Adequate for Discharge 11/21/2020 0758 by Katrina Stack, RN Outcome: Progressing   Problem: Clinical Measurements: Goal: Complications related to the disease process, condition or treatment will be avoided or minimized 11/21/2020 1522 by Katrina Stack, RN Outcome: Adequate for Discharge 11/21/2020 1522 by Katrina Stack, RN Outcome: Adequate for Discharge   Problem: Education: Goal: Knowledge of General Education information will improve Description: Including pain rating scale, medication(s)/side effects and non-pharmacologic comfort  measures 11/21/2020 1522 by Katrina Stack, RN Outcome: Adequate for Discharge 11/21/2020 1522 by Katrina Stack, RN Outcome: Adequate for Discharge   Problem: Health Behavior/Discharge Planning: Goal: Ability to manage health-related needs will improve 11/21/2020 1522 by Katrina Stack, RN Outcome:  Adequate for Discharge 11/21/2020 1522 by Katrina Stack, RN Outcome: Adequate for Discharge   Problem: Clinical Measurements: Goal: Ability to maintain clinical measurements within normal limits will improve 11/21/2020 1522 by Katrina Stack, RN Outcome: Adequate for Discharge 11/21/2020 1522 by Katrina Stack, RN Outcome: Adequate for Discharge Goal: Will remain free from infection 11/21/2020 1522 by Katrina Stack, RN Outcome: Adequate for Discharge 11/21/2020 1522 by Katrina Stack, RN Outcome: Adequate for Discharge Goal: Diagnostic test results will improve 11/21/2020 1522 by Katrina Stack, RN Outcome: Adequate for Discharge 11/21/2020 1522 by Katrina Stack, RN Outcome: Adequate for Discharge   Problem: Nutrition: Goal: Adequate nutrition will be maintained 11/21/2020 1522 by Katrina Stack, RN Outcome: Adequate for Discharge 11/21/2020 1522 by Katrina Stack, RN Outcome: Adequate for Discharge   Problem: Coping: Goal: Level of anxiety will decrease 11/21/2020 1522 by Katrina Stack, RN Outcome: Adequate for Discharge 11/21/2020 1522 by Katrina Stack, RN Outcome: Adequate for Discharge 11/21/2020 0758 by Katrina Stack, RN Outcome: Progressing   Problem: Pain Managment: Goal: General experience of comfort will improve 11/21/2020 1522 by Katrina Stack, RN Outcome: Adequate for Discharge 11/21/2020 1522 by Katrina Stack, RN Outcome: Adequate for Discharge 11/21/2020 0758 by Katrina Stack, RN Outcome: Progressing   Problem: Education: Goal: Knowledge of Pancreatitis treatment and prevention will improve 11/21/2020 1522 by  Katrina Stack, RN Outcome: Adequate for Discharge 11/21/2020 1522 by Katrina Stack, RN Outcome: Adequate for Discharge   Problem: Nutritional: Goal: Ability to achieve adequate nutritional intake will improve 11/21/2020 1522 by Katrina Stack, RN Outcome: Adequate for Discharge 11/21/2020 1522 by Katrina Stack, RN Outcome: Adequate for Discharge 11/21/2020 0758 by Katrina Stack, RN Outcome: Progressing   Problem: Clinical Measurements: Goal: Complications related to the disease process, condition or treatment will be avoided or minimized 11/21/2020 1522 by Katrina Stack, RN Outcome: Adequate for Discharge 11/21/2020 1522 by Katrina Stack, RN Outcome: Adequate for Discharge   Problem: Education: Goal: Knowledge of General Education information will improve Description: Including pain rating scale, medication(s)/side effects and non-pharmacologic comfort measures 11/21/2020 1522 by Katrina Stack, RN Outcome: Adequate for Discharge 11/21/2020 1522 by Katrina Stack, RN Outcome: Adequate for Discharge   Problem: Health Behavior/Discharge Planning: Goal: Ability to manage health-related needs will improve 11/21/2020 1522 by Katrina Stack, RN Outcome: Adequate for Discharge 11/21/2020 1522 by Katrina Stack, RN Outcome: Adequate for Discharge   Problem: Clinical Measurements: Goal: Ability to maintain clinical measurements within normal limits will improve 11/21/2020 1522 by Katrina Stack, RN Outcome: Adequate for Discharge 11/21/2020 1522 by Katrina Stack, RN Outcome: Adequate for Discharge Goal: Will remain free from infection 11/21/2020 1522 by Katrina Stack, RN Outcome: Adequate for Discharge 11/21/2020 1522 by Katrina Stack, RN Outcome: Adequate for Discharge Goal: Diagnostic test results will improve 11/21/2020 1522 by Katrina Stack, RN Outcome: Adequate for Discharge 11/21/2020 1522 by Katrina Stack, RN Outcome:  Adequate for Discharge   Problem: Nutrition: Goal: Adequate nutrition will be maintained 11/21/2020 1522 by Katrina Stack, RN Outcome: Adequate for Discharge 11/21/2020 1522 by Katrina Stack, RN Outcome: Adequate for Discharge   Problem: Coping: Goal: Level of anxiety will decrease 11/21/2020 1522 by Katrina Stack, RN Outcome: Adequate for Discharge 11/21/2020 1522 by Katrina Stack, RN Outcome: Adequate for Discharge 11/21/2020 0758 by Katrina Stack, RN Outcome: Progressing   Problem: Pain Managment:  Goal: General experience of comfort will improve 11/21/2020 1522 by Katrina Stack, RN Outcome: Adequate for Discharge 11/21/2020 1522 by Katrina Stack, RN Outcome: Adequate for Discharge 11/21/2020 0758 by Katrina Stack, RN Outcome: Progressing   Problem: Education: Goal: Knowledge of Pancreatitis treatment and prevention will improve 11/21/2020 1522 by Katrina Stack, RN Outcome: Adequate for Discharge 11/21/2020 1522 by Katrina Stack, RN Outcome: Adequate for Discharge   Problem: Nutritional: Goal: Ability to achieve adequate nutritional intake will improve 11/21/2020 1522 by Katrina Stack, RN Outcome: Adequate for Discharge 11/21/2020 1522 by Katrina Stack, RN Outcome: Adequate for Discharge 11/21/2020 0758 by Katrina Stack, RN Outcome: Progressing   Problem: Clinical Measurements: Goal: Complications related to the disease process, condition or treatment will be avoided or minimized 11/21/2020 1522 by Katrina Stack, RN Outcome: Adequate for Discharge 11/21/2020 1522 by Katrina Stack, RN Outcome: Adequate for Discharge   Problem: Education: Goal: Knowledge of General Education information will improve Description: Including pain rating scale, medication(s)/side effects and non-pharmacologic comfort measures 11/21/2020 1522 by Katrina Stack, RN Outcome: Adequate for Discharge 11/21/2020 1522 by Katrina Stack,  RN Outcome: Adequate for Discharge   Problem: Health Behavior/Discharge Planning: Goal: Ability to manage health-related needs will improve 11/21/2020 1522 by Katrina Stack, RN Outcome: Adequate for Discharge 11/21/2020 1522 by Katrina Stack, RN Outcome: Adequate for Discharge   Problem: Clinical Measurements: Goal: Ability to maintain clinical measurements within normal limits will improve 11/21/2020 1522 by Katrina Stack, RN Outcome: Adequate for Discharge 11/21/2020 1522 by Katrina Stack, RN Outcome: Adequate for Discharge Goal: Will remain free from infection 11/21/2020 1522 by Katrina Stack, RN Outcome: Adequate for Discharge 11/21/2020 1522 by Katrina Stack, RN Outcome: Adequate for Discharge Goal: Diagnostic test results will improve 11/21/2020 1522 by Katrina Stack, RN Outcome: Adequate for Discharge 11/21/2020 1522 by Katrina Stack, RN Outcome: Adequate for Discharge   Problem: Nutrition: Goal: Adequate nutrition will be maintained 11/21/2020 1522 by Katrina Stack, RN Outcome: Adequate for Discharge 11/21/2020 1522 by Katrina Stack, RN Outcome: Adequate for Discharge   Problem: Coping: Goal: Level of anxiety will decrease 11/21/2020 1522 by Katrina Stack, RN Outcome: Adequate for Discharge 11/21/2020 1522 by Katrina Stack, RN Outcome: Adequate for Discharge 11/21/2020 0758 by Katrina Stack, RN Outcome: Progressing   Problem: Pain Managment: Goal: General experience of comfort will improve 11/21/2020 1522 by Katrina Stack, RN Outcome: Adequate for Discharge 11/21/2020 1522 by Katrina Stack, RN Outcome: Adequate for Discharge 11/21/2020 0758 by Katrina Stack, RN Outcome: Progressing   Problem: Education: Goal: Knowledge of Pancreatitis treatment and prevention will improve 11/21/2020 1522 by Katrina Stack, RN Outcome: Adequate for Discharge 11/21/2020 1522 by Katrina Stack, RN Outcome: Adequate for  Discharge   Problem: Nutritional: Goal: Ability to achieve adequate nutritional intake will improve 11/21/2020 1522 by Katrina Stack, RN Outcome: Adequate for Discharge 11/21/2020 1522 by Katrina Stack, RN Outcome: Adequate for Discharge 11/21/2020 0758 by Katrina Stack, RN Outcome: Progressing   Problem: Clinical Measurements: Goal: Complications related to the disease process, condition or treatment will be avoided or minimized 11/21/2020 1522 by Katrina Stack, RN Outcome: Adequate for Discharge 11/21/2020 1522 by Katrina Stack, RN Outcome: Adequate for Discharge   Problem: Education: Goal: Knowledge of General Education information will improve Description: Including pain rating scale, medication(s)/side effects and non-pharmacologic comfort measures 11/21/2020 1522 by Dominga Ferry,  Harlow Ohmsenato L, RN Outcome: Adequate for Discharge 11/21/2020 1522 by Katrina StackAgustin, Shaquel Josephson L, RN Outcome: Adequate for Discharge   Problem: Health Behavior/Discharge Planning: Goal: Ability to manage health-related needs will improve 11/21/2020 1522 by Katrina StackAgustin, Lenord Fralix L, RN Outcome: Adequate for Discharge 11/21/2020 1522 by Katrina StackAgustin, Trystan Eads L, RN Outcome: Adequate for Discharge   Problem: Clinical Measurements: Goal: Ability to maintain clinical measurements within normal limits will improve 11/21/2020 1522 by Katrina StackAgustin, Mckinnley Cottier L, RN Outcome: Adequate for Discharge 11/21/2020 1522 by Katrina StackAgustin, Takiyah Bohnsack L, RN Outcome: Adequate for Discharge Goal: Will remain free from infection 11/21/2020 1522 by Katrina StackAgustin, Velinda Wrobel L, RN Outcome: Adequate for Discharge 11/21/2020 1522 by Katrina StackAgustin, Shatarra Wehling L, RN Outcome: Adequate for Discharge Goal: Diagnostic test results will improve 11/21/2020 1522 by Katrina StackAgustin, Reida Hem L, RN Outcome: Adequate for Discharge 11/21/2020 1522 by Katrina StackAgustin, Carsen Machi L, RN Outcome: Adequate for Discharge   Problem: Nutrition: Goal: Adequate nutrition will be maintained 11/21/2020 1522 by Katrina StackAgustin,  Guelda Batson L, RN Outcome: Adequate for Discharge 11/21/2020 1522 by Katrina StackAgustin, Berry Godsey L, RN Outcome: Adequate for Discharge   Problem: Coping: Goal: Level of anxiety will decrease 11/21/2020 1522 by Katrina StackAgustin, Annaleah Arata L, RN Outcome: Adequate for Discharge 11/21/2020 1522 by Katrina StackAgustin, Neeley Sedivy L, RN Outcome: Adequate for Discharge 11/21/2020 0758 by Katrina StackAgustin, Johnthan Axtman L, RN Outcome: Progressing   Problem: Pain Managment: Goal: General experience of comfort will improve 11/21/2020 1522 by Katrina StackAgustin, Tavaughn Silguero L, RN Outcome: Adequate for Discharge 11/21/2020 1522 by Katrina StackAgustin, Kalaya Infantino L, RN Outcome: Adequate for Discharge 11/21/2020 0758 by Katrina StackAgustin, Morgyn Marut L, RN Outcome: Progressing

## 2020-11-21 NOTE — Discharge Summary (Addendum)
[facility] Discharge Summary  Patient name: Frederick Morales Medical record number: [id number] Date of birth: 09-25-1955 Age: 65 y.o. Gender: male Date of Admission: 11/19/2020  Date of Discharge: 11/21/20 Admitting Physician: Doreene Eland, MD  Primary Care Provider: Armando Gang, FNP Consultants: None  Indication for Hospitalization: Abdominal pain, acute pancreatitis  Discharge Diagnoses/Problem List:  Idiopathic vs. Medication induced pancreatitis Type 2 DM HTN  Disposition: Home  Discharge Condition: Stable  Discharge Exam:  Blood pressure (!) 152/108, pulse 70, temperature 97.9 F (36.6 C), temperature source Oral, resp. rate 17, SpO2 100 %. Physical Exam: General: Awake, alert, no distress, pleasant Cardiovascular: RRR without murmur Respiratory: CTA B Abdomen: Soft, mild tenderness to the right upper quadrant and epigastric region, no rebound or guarding, nondistended Extremities: No edema, 2+ radial and DP pulses    Brief Hospital Course:  Frederick Morales is a 65 y.o. male who presented with abdominal pain, found to have acute pancreatitis . PMH is significant for previous pancreatitis, HTN, T2DM, Hep C (treated). Below is his hospital course listed by problem. Refer to the H&P for additional information.  Acute Pancreatitis Lipase 120. CT Abd/pelv suggestive of acute pancreatitis involving the pancreatitic head and uncinate process without pseudocyst or necrosis. Given 4mg  Morphine x2 in ED. Pain control on admission with Toradol and Dilaudid. Patient started on clear liquid diet and advanced as tolerated. Metformin, Lisinopril, Atorvastatin and Victoza held during admission (and discontinued at discharge) as these could cause pancreatitis. Lipid panel with decreased HDL but otherwise unremarkable. RUQ U/S negative. Patient was able to tolerate diet with improved pain on d/c. Switched to oral pain regimen with scheduled Ibuprofen  and 5 mg Oxy IR PRN for breakthrough pain on d/c.    Other problems chronic and stable.   Follow Up: 1. Victoza stopped on d/c due to acute concern it may have contributed to acute pancreatitis 2. Reasses diabetes mellitus management outpatient since Victoza d/c. Avoid GLP-1.  Significant Procedures: None  Significant Labs and Imaging:  Recent Labs  Lab 11/19/20 1035  WBC 8.6  HGB 14.8  HCT 42.8  PLT 252   Recent Labs  Lab 11/19/20 1035 11/20/20 0433 11/21/20 0302  NA 132* 132* 137  K 3.7 3.8 4.0  CL 99 101 102  CO2 24 24 28   GLUCOSE 190* 230* 121*  BUN 10 9 9   CREATININE 0.72 0.64 0.66  CALCIUM 9.4 8.5* 9.0  ALKPHOS 52  --   --   AST 17  --   --   ALT 22  --   --   ALBUMIN 4.0  --   --    3/15: CT Abd/Pelv with contrast IMPRESSION: Findings suggestive of acute pancreatitis involving the pancreatic head and uncinate process. No adjacent pseudocyst or evidence of pancreatic necrosis Inflammatory changes surrounding the duodenum. Diverticulosis without diverticulitis Aortic Atherosclerosis (ICD10-I70.0).  3/15: RUQ U/S IMPRESSION: Negative examination  Results/Tests Pending at Time of Discharge: None  Discharge Medications:  Allergies as of 11/21/2020      Reactions   Penicillins Swelling   Facial swelling Has patient had a PCN reaction causing immediate rash, facial/tongue/throat swelling, SOB or lightheadedness with hypotension: No Haspatient had a PCN reaction causing severe rash involving mucus membranes or skin necrosis/No Has patient had a PCN reaction that required hospitalization /No Has patient had a PCN reaction occurring within the last 10 years: No If all of the above answers are "NO", then may proceed with Cephalosporin use.  Medication List    STOP taking these medications   doxycycline 20 MG tablet Commonly known as: PERIOSTAT   liraglutide 18 MG/3ML Sopn Commonly known as: VICTOZA     TAKE these medications   Apple Cider  Vinegar 500 MG Tabs Take 3 tablets by mouth daily.   aspirin 81 MG chewable tablet Chew 81 mg by mouth daily.   atorvastatin 40 MG tablet Commonly known as: LIPITOR Take 40 mg by mouth daily.   cholecalciferol 25 MCG (1000 UNIT) tablet Commonly known as: VITAMIN D3 Take 1,000 Units by mouth daily.   Concerta 27 MG CR tablet Generic drug: methylphenidate Take 27 mg by mouth every morning.   Flaxseed Oil 1000 MG Caps Take 1,000 mg by mouth daily.   ibuprofen 600 MG tablet Commonly known as: ADVIL Take 1 tablet (600 mg total) by mouth every 6 (six) hours as needed for mild pain or moderate pain. Take with food.   Investigational - Study Medication Apply 1 application topically daily. Study name: Additional study details: Testosterone Cream   lisinopril 10 MG tablet Commonly known as: ZESTRIL Take 10 mg by mouth daily.   metFORMIN 1000 MG tablet Commonly known as: GLUCOPHAGE Take 1,000 mg by mouth 2 (two) times daily with a meal.   multivitamin with minerals tablet Take 1 tablet by mouth daily.   oxyCODONE 5 MG immediate release tablet Commonly known as: Oxy IR/ROXICODONE Take 1 tablet (5 mg total) by mouth every 4 (four) hours as needed for severe pain or breakthrough pain.   tamsulosin 0.4 MG Caps capsule Commonly known as: FLOMAX Take 0.4 mg by mouth daily.   vitamin C 500 MG tablet Commonly known as: ASCORBIC ACID Take 500 mg by mouth daily.       Discharge Instructions: Please refer to Patient Instructions section of EMR for full details.  Patient was counseled important signs and symptoms that should prompt return to medical care, changes in medications, dietary instructions, activity restrictions, and follow up appointments.   Follow-Up Appointments:  Follow-up Information    Armando Gang, FNP Follow up.   Specialty: Family Medicine Why: Follow up with your PCP in the next 1-2 days after discharge.  Contact information: [address] [place] [address] [phone]               Sabino Dick, DO 11/21/2020, 3:33 PM PGY-1, [facility]

## 2020-11-21 NOTE — Progress Notes (Signed)
Family Medicine Teaching Service Daily Progress Note Intern Pager: [phone]  Patient name: BRANCE DARTT Medical record number: [id number] Date of birth: 05/15/1956 Age: 65 y.o. Gender: male  Primary Care Provider: Armando Gang, FNP Consultants: None Code Status: FULL  Pt Overview and Major Events to Date:  3/15: Admitted  Assessment and Plan: Breland Trouten Harrisonis a 65 y.o.malewho presented with abdominal pain, found to have acute pancreatitis. PMH is significant for previous pancreatitis, HTN, T2DM,Hep C (treated).  Idiopathic vs. drug-induced acute pancreatitis: improving Today patient feels better, pain is 4-5/10.  Diet has been advanced to carb modified.  He is tolerating well, had roast beef and mashed potatoes last night and pancakes this AM without vomiting. Required Dilaudid x5 yesterday and Toradol x1.  Will trial oral pain medication now as patient is able to tolerate food and demonstrating improvement in pain. -Ibuprofen 600 every 6 hours -Oxy 5 mg every 4 hours PRN breakthrough pain -DC IV fluids  Hypertension BPs ranging 120s to the 151/78-91.  Most recent BP 146/84. -Holding home lisinopril -Vitals per floor protocol  Type 2 DM: chronic, stable Random glucose this a.m. 121.  CBGs ranging 121-180.  Received 7 units of sliding scale insulin yesterday. -mSSI  -Monitor CBG's  ADHD:chronic, stable On concerta 27 mg.  -Continue Concerta daily   IDP:OEUMPNT, stable On Flomax 0.4 mg daily. -Continue Flomax  Hepatitis C- treated Patient reports that he was treated in 2016 with harvoni and cleared.   FEN/GI: Carb modified PPx: Lovenox   Status is: Inpatient  Remains inpatient appropriate because:Ongoing active pain requiring inpatient pain management   Dispo: The patient is from: Home              Anticipated d/c is to: Home              Patient currently is not medically stable to d/c.   Difficult to place patient No   Subjective:   Patient feels that his pain is improving.  States he feels much better than when he first came in.  Was able to eat roast beef and mashed potatoes last night, had some pancakes this morning that did not sit right with him but has not vomited.  He is amenable to trialing oral pain medications today and hopefully discharging later this afternoon.  He has home responsibilities and feels that he would be able to continue pain regimen at home as needed.   Objective: Temp:  [97.8 F (36.6 C)-98.5 F (36.9 C)] 98.5 F (36.9 C) (03/17 0454) Pulse Rate:  [68-78] 70 (03/17 0454) Resp:  [16-18] 18 (03/17 0454) BP: (122-151)/(70-91) 146/84 (03/17 0454) SpO2:  [97 %-100 %] 97 % (03/17 0454) Physical Exam: General: Awake, alert, no distress, pleasant Cardiovascular: RRR without murmur Respiratory: CTA B Abdomen: Soft, mild tenderness to the right upper quadrant and epigastric region, no rebound or guarding, nondistended Extremities: No edema, 2+ radial and DP pulses   Laboratory: Recent Labs  Lab 11/19/20 1035  WBC 8.6  HGB 14.8  HCT 42.8  PLT 252   Recent Labs  Lab 11/19/20 1035 11/20/20 0433 11/21/20 0302  NA 132* 132* 137  K 3.7 3.8 4.0  CL 99 101 102  CO2 24 24 28   BUN 10 9 9   CREATININE 0.72 0.64 0.66  CALCIUM 9.4 8.5* 9.0  PROT 7.2  --   --   BILITOT 1.2  --   --   ALKPHOS 52  --   --   ALT 22  --   --  AST 17  --   --   GLUCOSE 190* 230* 121*     Imaging/Diagnostic Tests: None new.  Sabino Dick, DO 11/21/2020, 6:00 AM PGY-1, [facility] FPTS Intern pager: [phone], text pages welcome

## 2020-11-21 NOTE — Progress Notes (Signed)
DISCHARGE NOTE HOME KINSEY COWSERT to be discharged Home per MD order. Discussed prescriptions and follow up appointments with the patient. Prescriptions given to patient; medication list explained in detail. Patient verbalized understanding.  Skin clean, dry and intact without evidence of skin break down, no evidence of skin tears noted. IV catheter discontinued intact. Site without signs and symptoms of complications. Dressing and pressure applied. Pt denies pain at the site currently. No complaints noted.  Patient free of lines, drains, and wounds.   An After Visit Summary (AVS) was printed and given to the patient. Prescription medication delivered by pharmacy to bedside and received by patient. Patient ambulated to front lobby and discharged home via private auto.  Katrina Stack, RN

## 2020-11-21 NOTE — Progress Notes (Signed)
Nutrition Brief Note  Patient identified on the Malnutrition Screening Tool (MST) Report  Wt Readings from Last 15 Encounters:  09/13/20 77.1 kg  06/21/15 84.4 kg  12/27/14 85 kg  07/22/14 79.4 kg   Per Care Everywhere, pt weighed 77.7 kg in May 2021.   There is no height or weight on file to calculate BMI.  Current diet order is carb modified, patient is consuming approximately 100% of meals at this time. Labs and medications reviewed.   No nutrition interventions warranted at this time. If nutrition issues arise, please consult RD.   Frederick Gavia, MS, RD, LDN RD pager number and weekend/on-call pager number located in [place].

## 2020-12-04 ENCOUNTER — Other Ambulatory Visit: Payer: Self-pay

## 2020-12-04 ENCOUNTER — Ambulatory Visit (INDEPENDENT_AMBULATORY_CARE_PROVIDER_SITE_OTHER): Payer: Medicaid Other | Admitting: Dermatology

## 2020-12-04 ENCOUNTER — Encounter: Payer: Self-pay | Admitting: Dermatology

## 2020-12-04 DIAGNOSIS — K130 Diseases of lips: Secondary | ICD-10-CM

## 2020-12-04 DIAGNOSIS — L710 Perioral dermatitis: Secondary | ICD-10-CM | POA: Diagnosis not present

## 2020-12-04 DIAGNOSIS — L089 Local infection of the skin and subcutaneous tissue, unspecified: Secondary | ICD-10-CM | POA: Diagnosis not present

## 2020-12-04 MED ORDER — SULFAMETHOXAZOLE-TRIMETHOPRIM 800-160 MG PO TABS: 1.0000 | ORAL_TABLET | Freq: Two times a day (BID) | ORAL | 0 refills | Status: AC

## 2020-12-04 NOTE — Patient Instructions (Addendum)

## 2020-12-04 NOTE — Progress Notes (Signed)
   Follow-Up Visit   Subjective  Frederick Morales is a 65 y.o. male who presents for the following: Follow-up (Recheck perioral dermatitis/angular cheilitis. Improved per patient. D/C Doxycycline while hospitalized for pancreatitis. Used HC 2.5% and Mupirocin. ) and Skin Problem (Patient C/O painful bump on neck. Several days. Restarted Doxycycline to see if it would help. ).  The following portions of the chart were reviewed this encounter and updated as appropriate:  Tobacco  Allergies  Meds  Problems  Med Hx  Surg Hx  Fam Hx      Review of Systems: No other skin or systemic complaints except as noted in HPI or Assessment and Plan.  Objective  Well appearing patient in no apparent distress; mood and affect are within normal limits.  A focused examination was performed including head, including the scalp, face, neck, nose, ears, eyelids, and lips and face, neck. Relevant physical exam findings are noted in the Assessment and Plan.  Objective  Neck - Posterior: Erythematous crusted nodule  Objective  perioral: Clear today  Assessment & Plan  Local infection of skin and subcutaneous tissue Neck - Posterior  Start Bactrim DS 1 po Bid x7 days  Start Mupirocin TID  Not using Doxycycline due to possibility it triggered pancreatitis.   Bacterial C&S performed today; results pending.   sulfamethoxazole-trimethoprim (BACTRIM DS) 800-160 MG tablet - Neck - Posterior  Other Related Procedures Anaerobic and Aerobic Culture  Angular cheilitis perioral  Restart Mupirocin, HC, Ketoconazole twice a day as needed flares up to 2 weeks  Return if symptoms worsen or fail to improve.   Perioral dermatitis Currently clear If recurs, will use topicals given possibility of doxycycline vs other medication triggering pancreatitis  I, Lawson Radar, CMA, am acting as scribe for Darden Dates, MD.  Documentation: I have reviewed the above documentation for accuracy and completeness,  and I agree with the above.  Darden Dates, MD

## 2020-12-10 LAB — ANAEROBIC AND AEROBIC CULTURE

## 2020-12-10 LAB — SPECIMEN STATUS REPORT

## 2020-12-11 ENCOUNTER — Telehealth: Payer: Self-pay

## 2020-12-11 NOTE — Telephone Encounter (Signed)
-----   Message from Sandi Mealy, MD sent at 12/10/2020  3:18 PM EDT ----- Methicillin-resistant staphylococcus aureus (MRSA) infection, sensitive to trimethoprim-sulfamethoxazole (pt already on).   Recommend finishing treatment course with trimethoprim-sulfamethoxazole and if not completely better, please call clinic.  Also recommend using mupirocin twice a day in bilateral nares for 10 days to help get rid of any MRSA bacteria in the nose.  MAs please call. Thank you!

## 2020-12-11 NOTE — Telephone Encounter (Signed)
Patient called and notified of culture results and providers recommendations. He reports he has noticed area is starting to look much better and will continue medications as recommended. He denied further questions at this time.

## 2020-12-31 ENCOUNTER — Other Ambulatory Visit: Payer: Self-pay | Admitting: Dermatology

## 2020-12-31 DIAGNOSIS — L710 Perioral dermatitis: Secondary | ICD-10-CM

## 2021-06-28 IMAGING — US US ABDOMEN LIMITED RUQ/ASCITES
1 series · 14 of 25 positions shown · non-contrast
Comparison: CT 11/19/2020

CLINICAL DATA: Pancreatitis

EXAM:
ULTRASOUND ABDOMEN LIMITED RIGHT UPPER QUADRANT

[Series 1: us abdomen limited ruq (liver/gb) · 14 of 42 slices shown]
[im 1/42]
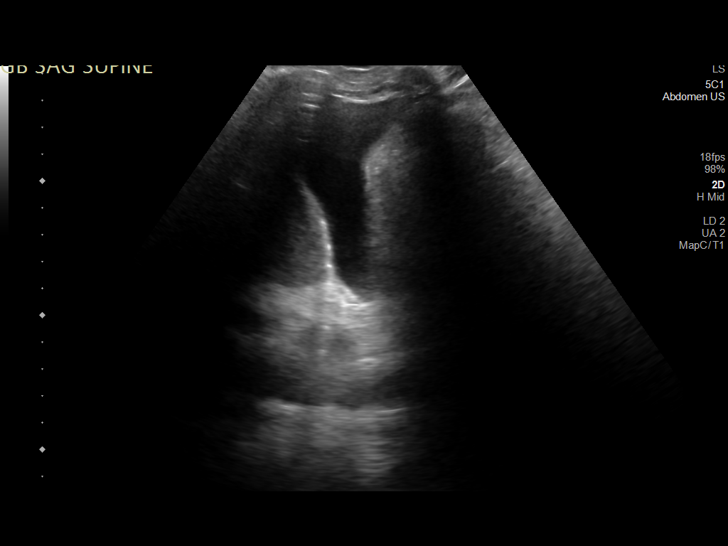
[im 4/42]
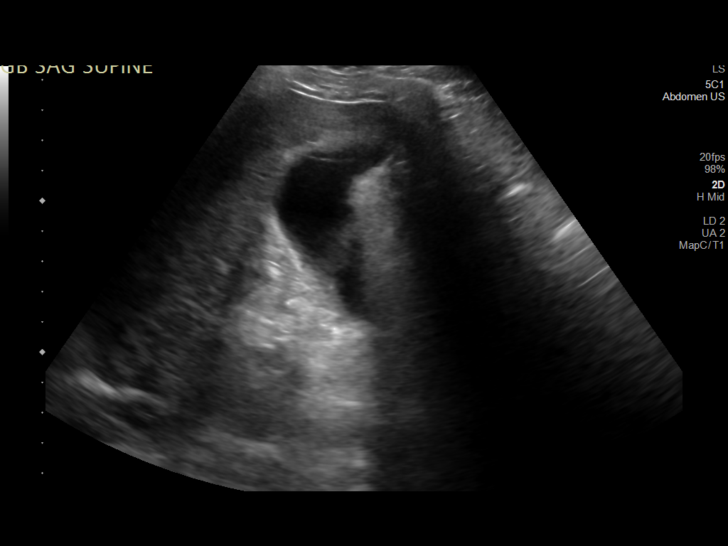
[im 7/42]
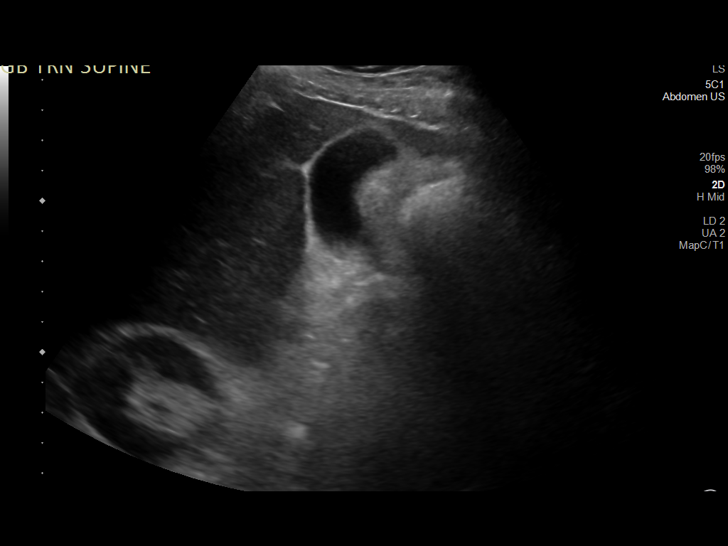
[im 11/42]
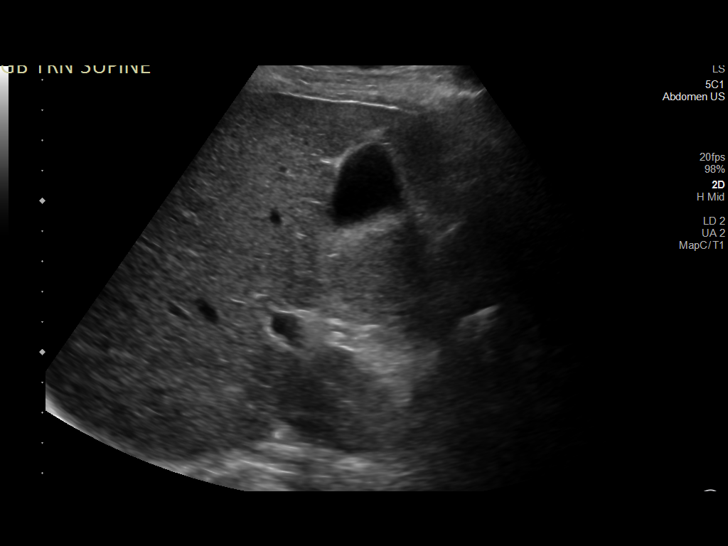
[im 14/42]
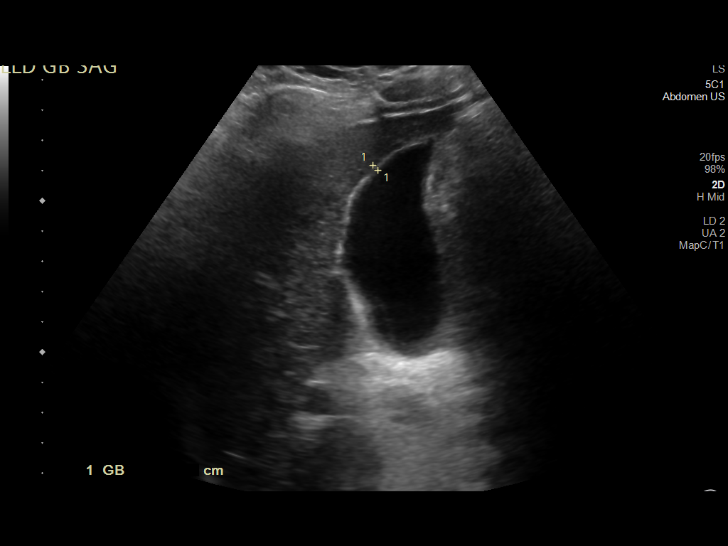
[im 16/42]
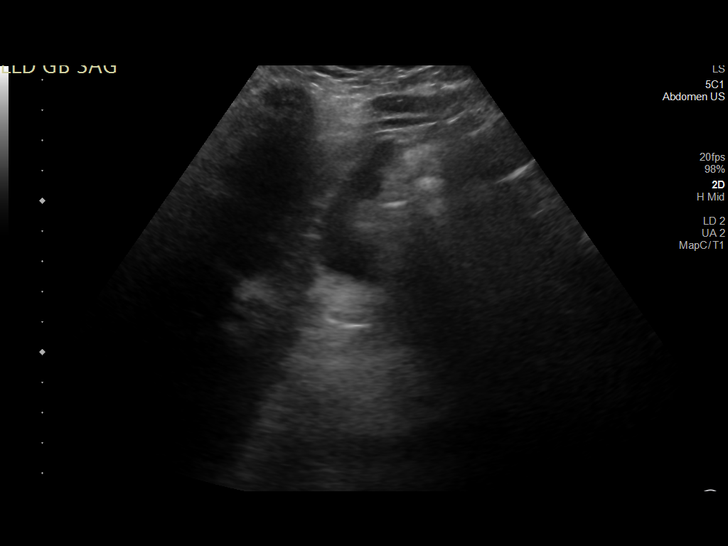
[im 19/42]
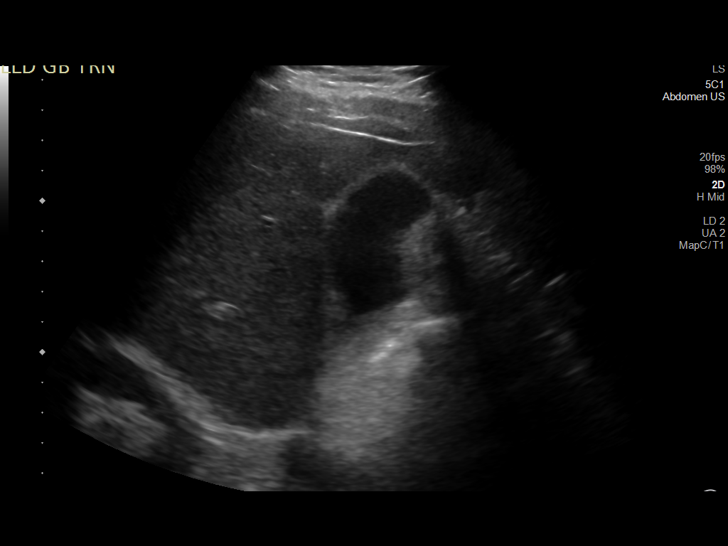
[im 23/42]
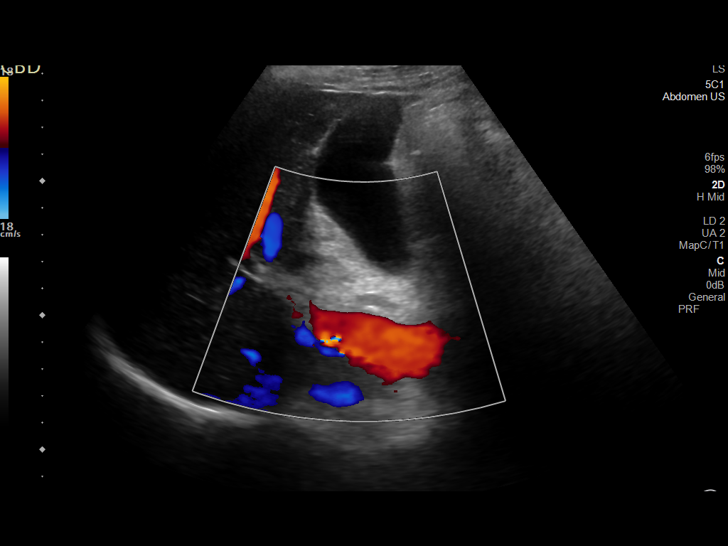
[im 26/42]
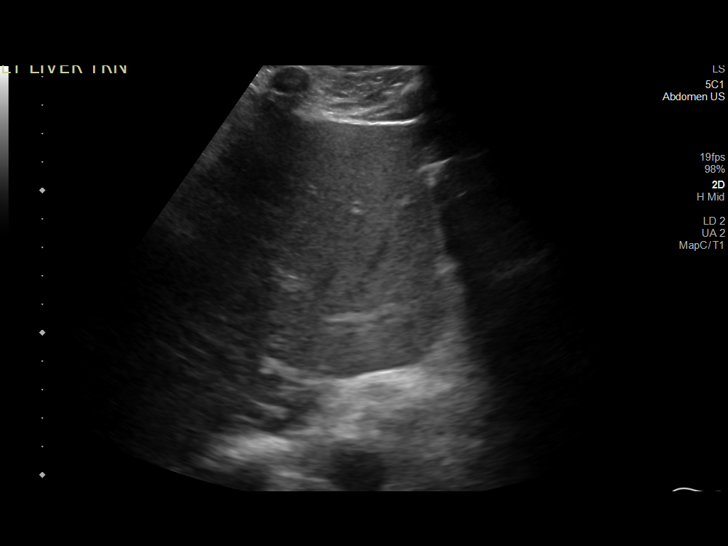
[im 28/42]
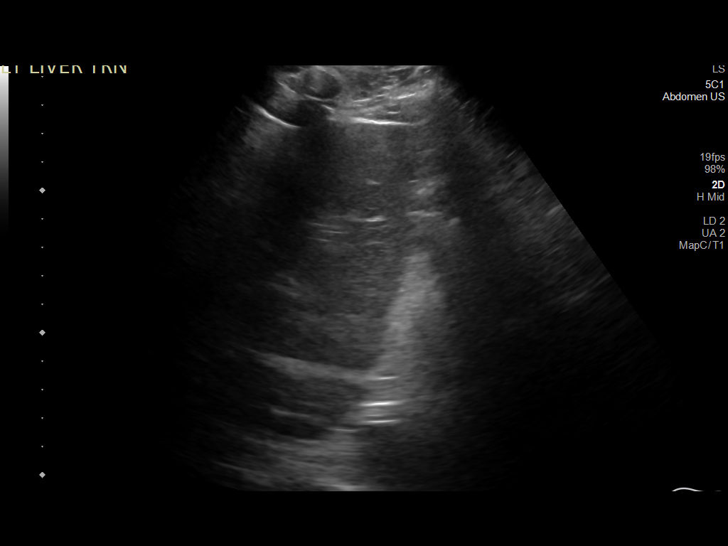
[im 31/42]
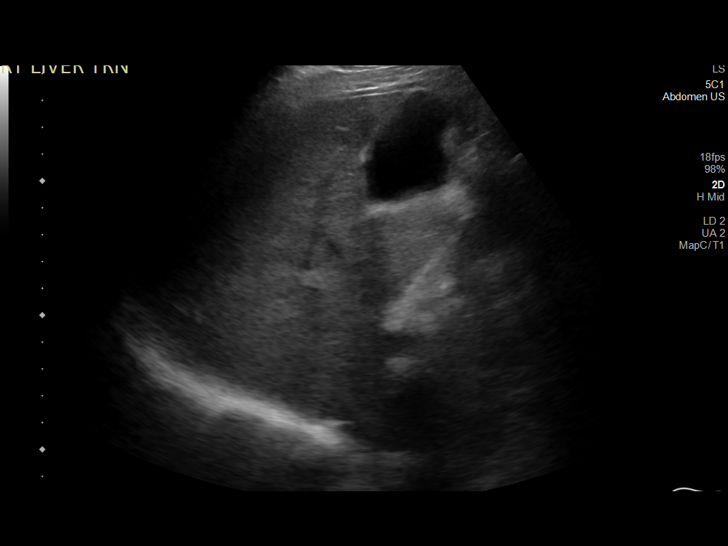
[im 35/42]
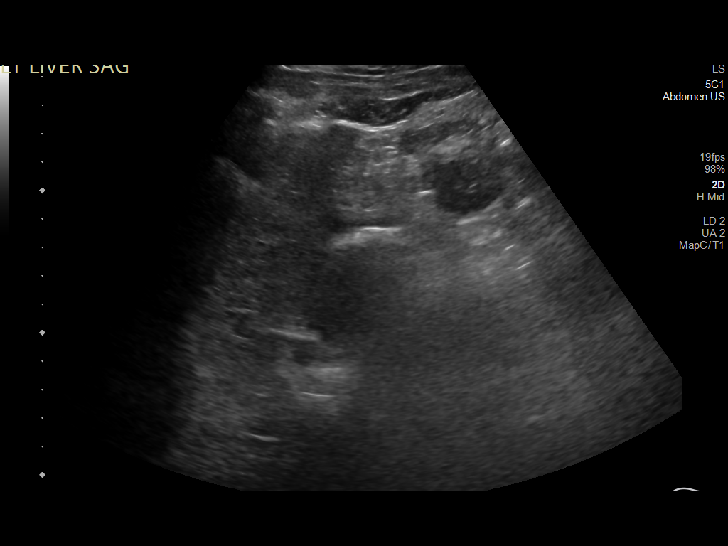
[im 38/42]
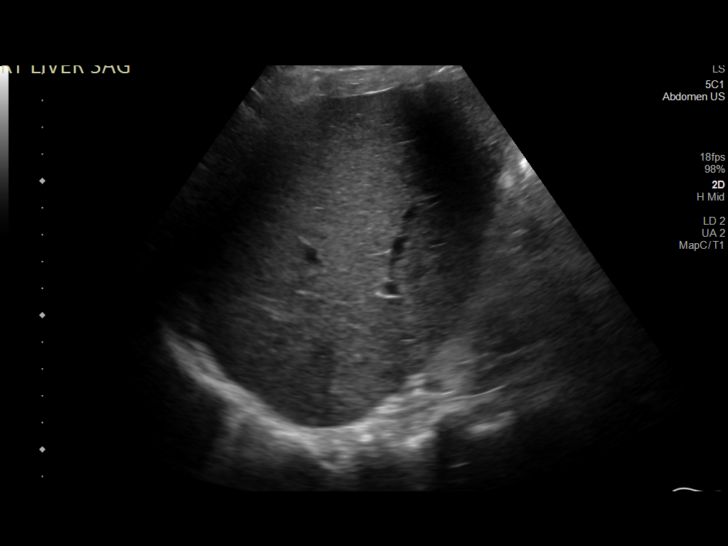
[im 42/42]
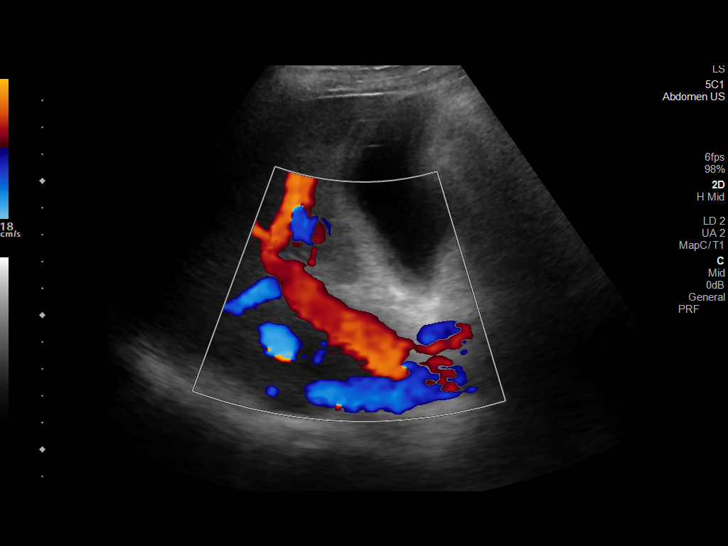

[14 of 25 positions shown; findings below may reference images not displayed]

FINDINGS: Gallbladder:

No gallstones or wall thickening visualized. No sonographic Murphy
sign noted by sonographer.

Common bile duct:

Diameter: 4.2 mm

Liver:

No focal lesion identified. Within normal limits in parenchymal
echogenicity. Portal vein is patent on color Doppler imaging with
normal direction of blood flow towards the liver.

Other: None.
IMPRESSION: Negative examination

## 2021-08-02 ENCOUNTER — Emergency Department (HOSPITAL_COMMUNITY)
Admission: EM | Admit: 2021-08-02 | Discharge: 2021-08-03 | Disposition: A | Discharge status: Home / Self Care | Payer: Medicaid Other | Attending: Emergency Medicine | Admitting: Emergency Medicine

## 2021-08-02 ENCOUNTER — Encounter: Payer: Self-pay | Admitting: Emergency Medicine

## 2021-08-02 ENCOUNTER — Emergency Department
Admission: EM | Admit: 2021-08-02 | Discharge: 2021-08-02 | Disposition: A | Discharge status: Home / Self Care | Payer: Medicaid Other | Attending: Emergency Medicine | Admitting: Emergency Medicine

## 2021-08-02 ENCOUNTER — Other Ambulatory Visit: Payer: Self-pay

## 2021-08-02 DIAGNOSIS — R1032 Left lower quadrant pain: Secondary | ICD-10-CM | POA: Insufficient documentation

## 2021-08-02 DIAGNOSIS — Z79899 Other long term (current) drug therapy: Secondary | ICD-10-CM | POA: Insufficient documentation

## 2021-08-02 DIAGNOSIS — Z7982 Long term (current) use of aspirin: Secondary | ICD-10-CM | POA: Diagnosis not present

## 2021-08-02 DIAGNOSIS — Z7984 Long term (current) use of oral hypoglycemic drugs: Secondary | ICD-10-CM | POA: Diagnosis not present

## 2021-08-02 DIAGNOSIS — K5792 Diverticulitis of intestine, part unspecified, without perforation or abscess without bleeding: Secondary | ICD-10-CM

## 2021-08-02 DIAGNOSIS — I10 Essential (primary) hypertension: Secondary | ICD-10-CM | POA: Insufficient documentation

## 2021-08-02 DIAGNOSIS — E119 Type 2 diabetes mellitus without complications: Secondary | ICD-10-CM | POA: Insufficient documentation

## 2021-08-02 DIAGNOSIS — Z87891 Personal history of nicotine dependence: Secondary | ICD-10-CM | POA: Diagnosis not present

## 2021-08-02 DIAGNOSIS — Z5321 Procedure and treatment not carried out due to patient leaving prior to being seen by health care provider: Secondary | ICD-10-CM | POA: Insufficient documentation

## 2021-08-02 LAB — CBC WITH DIFFERENTIAL/PLATELET
Abs Immature Granulocytes: 0.04 10*3/uL (ref 0.00–0.07)
Basophils Absolute: 0.1 10*3/uL (ref 0.0–0.1)
Basophils Relative: 1 %
Eosinophils Absolute: 0.4 10*3/uL (ref 0.0–0.5)
Eosinophils Relative: 4 %
HCT: 39.1 % (ref 39.0–52.0)
Hemoglobin: 13.4 g/dL (ref 13.0–17.0)
Immature Granulocytes: 1 %
Lymphocytes Relative: 15 %
Lymphs Abs: 1.3 10*3/uL (ref 0.7–4.0)
MCH: 30.3 pg (ref 26.0–34.0)
MCHC: 34.3 g/dL (ref 30.0–36.0)
MCV: 88.5 fL (ref 80.0–100.0)
Monocytes Absolute: 0.7 10*3/uL (ref 0.1–1.0)
Monocytes Relative: 8 %
Neutro Abs: 6 10*3/uL (ref 1.7–7.7)
Neutrophils Relative %: 71 %
Platelets: 221 10*3/uL (ref 150–400)
RBC: 4.42 MIL/uL (ref 4.22–5.81)
RDW: 12.1 % (ref 11.5–15.5)
WBC: 8.4 10*3/uL (ref 4.0–10.5)
nRBC: 0 % (ref 0.0–0.2)

## 2021-08-02 LAB — COMPREHENSIVE METABOLIC PANEL
ALT: 25 U/L (ref 0–44)
AST: 20 U/L (ref 15–41)
Albumin: 4.3 g/dL (ref 3.5–5.0)
Alkaline Phosphatase: 48 U/L (ref 38–126)
Anion gap: 7 (ref 5–15)
BUN: 13 mg/dL (ref 8–23)
CO2: 28 mmol/L (ref 22–32)
Calcium: 9.4 mg/dL (ref 8.9–10.3)
Chloride: 100 mmol/L (ref 98–111)
Creatinine, Ser: 0.63 mg/dL (ref 0.61–1.24)
GFR, Estimated: >60 mL/min (ref >60)
Glucose, Bld: 189 mg/dL — ABNORMAL HIGH (ref 70–99)
Potassium: 4.3 mmol/L (ref 3.5–5.1)
Sodium: 135 mmol/L (ref 135–145)
Total Bilirubin: 0.8 mg/dL (ref 0.3–1.2)
Total Protein: 7.4 g/dL (ref 6.5–8.1)

## 2021-08-02 LAB — URINALYSIS, ROUTINE W REFLEX MICROSCOPIC
Bacteria, UA: NONE SEEN
Bilirubin Urine: NEGATIVE
Glucose, UA: 100 mg/dL — AB
Hgb urine dipstick: NEGATIVE
Ketones, ur: NEGATIVE mg/dL
Leukocytes,Ua: NEGATIVE
Nitrite: NEGATIVE
Protein, ur: NEGATIVE mg/dL
Specific Gravity, Urine: 1.015 (ref 1.005–1.030)
Squamous Epithelial / HPF: NONE SEEN (ref 0–5)
pH: 6.5 (ref 5.0–8.0)

## 2021-08-02 LAB — CBC
HCT: 39.3 % (ref 39.0–52.0)
Hemoglobin: 13.5 g/dL (ref 13.0–17.0)
MCH: 30.3 pg (ref 26.0–34.0)
MCHC: 34.4 g/dL (ref 30.0–36.0)
MCV: 88.3 fL (ref 80.0–100.0)
Platelets: 232 10*3/uL (ref 150–400)
RBC: 4.45 MIL/uL (ref 4.22–5.81)
RDW: 12 % (ref 11.5–15.5)
WBC: 9.2 10*3/uL (ref 4.0–10.5)
nRBC: 0 % (ref 0.0–0.2)

## 2021-08-02 LAB — LIPASE, BLOOD: Lipase: 167 U/L — ABNORMAL HIGH (ref 11–51)

## 2021-08-02 NOTE — ED Provider Notes (Signed)
Emergency Medicine Provider Triage Evaluation Note  Frederick Morales , a 65 y.o. male  was evaluated in triage.  Pt complains of abdominal pain.  Ongoing for a few days now.  Has history of diverticulitis and pancreatitis in the past.  Denies EtOH, reports that prior pancreatitis felt to be related to his diabetic medications so was switched off of those.  No hx of gallstones that he is aware of.  Review of Systems  Positive: Abdominal pain Negative: Fever, vomiting  Physical Exam  BP (!) 136/104 (BP Location: Right Arm)   Pulse 89   Temp 98.4 F (36.9 C) (Oral)   Resp 18   Ht 5\' 9"  (1.753 m)   Wt 73.9 kg   SpO2 98%   BMI 24.07 kg/m   Gen:   Awake, no distress   Resp:  Normal effort  MSK:   Moves extremities without difficulty  Other:  Tender epigastric but also towards LLQ  Medical Decision Making  Medically screening exam initiated at 10:47 PM.  Appropriate orders placed.  Frederick Morales was informed that the remainder of the evaluation will be completed by another provider, this initial triage assessment does not replace that evaluation, and the importance of remaining in the ED until their evaluation is complete.  Abdominal pain.  Hx of pancreatitis and diverticulitis in the past.  Labs, CT scan.   Su Monks, PA-C 08/02/21 2256    08/04/21, MD 08/03/21 [phone]

## 2021-08-02 NOTE — ED Triage Notes (Signed)
Pt via POV from home. Pt c/o  LLQ pain for the past couple of days. Pt denies NVD. Denies fever. Pt has a hx of pancreatitis and diverticulitis. Pt is A&OX4 and NAD

## 2021-08-02 NOTE — ED Provider Notes (Signed)
Emergency Medicine Provider Triage Evaluation Note  Frederick Morales, a 65 y.o. male  was evaluated in triage.  Pt complains of left lower quadrant abdominal pain.  Patient reports since for last 2 to 3 days per he denies any associated nausea, vomiting, diarrhea.  He also denies any fevers, chills, sweats patient gives remote history of pancreatitis and diverticulitis..  Review of Systems  Positive: LLQ abd pain  Negative: NVD  Physical Exam  BP 136/84 (BP Location: Left Arm)   Pulse 84   Temp 97.8 F (36.6 C) (Oral)   Resp 18   Ht 5\' 9"  (1.753 m)   Wt 74.4 kg   SpO2 98%   BMI 24.22 kg/m  Gen:   Awake, no distress  NAD Resp:  Normal effort CTA MSK:   Moves extremities without difficulty  Other:  ABD: soft, nontender  Medical Decision Making  Medically screening exam initiated at 2:34 PM.  Appropriate orders placed.  BRENT TAILLON was informed that the remainder of the evaluation will be completed by another provider, this initial triage assessment does not replace that evaluation, and the importance of remaining in the ED until their evaluation is complete.  Patient with a history of diverticulitis and pancreatitis, presents to the ED with complaints of left lower quad abdominal pain.  He denies any associated nausea, vomiting, or diarrhea.   Su Monks, PA-C 08/02/21 1435    08/04/21, MD 08/02/21 1755

## 2021-08-02 NOTE — ED Triage Notes (Addendum)
Pt c/o left side abd pain. Pt has a history of pancreatitis.

## 2021-08-02 NOTE — ED Notes (Signed)
Pt left to go to [facility]

## 2021-08-03 ENCOUNTER — Emergency Department (HOSPITAL_COMMUNITY): Payer: Medicaid Other

## 2021-08-03 LAB — URINALYSIS, ROUTINE W REFLEX MICROSCOPIC
Bilirubin Urine: NEGATIVE
Glucose, UA: NEGATIVE mg/dL
Hgb urine dipstick: NEGATIVE
Ketones, ur: NEGATIVE mg/dL
Leukocytes,Ua: NEGATIVE
Nitrite: NEGATIVE
Protein, ur: NEGATIVE mg/dL
Specific Gravity, Urine: 1.009 (ref 1.005–1.030)
pH: 7 (ref 5.0–8.0)

## 2021-08-03 LAB — COMPREHENSIVE METABOLIC PANEL
ALT: 24 U/L (ref 0–44)
AST: 20 U/L (ref 15–41)
Albumin: 4 g/dL (ref 3.5–5.0)
Alkaline Phosphatase: 42 U/L (ref 38–126)
Anion gap: 8 (ref 5–15)
BUN: 11 mg/dL (ref 8–23)
CO2: 27 mmol/L (ref 22–32)
Calcium: 9.5 mg/dL (ref 8.9–10.3)
Chloride: 101 mmol/L (ref 98–111)
Creatinine, Ser: 0.73 mg/dL (ref 0.61–1.24)
GFR, Estimated: >60 mL/min (ref >60)
Glucose, Bld: 183 mg/dL — ABNORMAL HIGH (ref 70–99)
Potassium: 4.1 mmol/L (ref 3.5–5.1)
Sodium: 136 mmol/L (ref 135–145)
Total Bilirubin: 0.3 mg/dL (ref 0.3–1.2)
Total Protein: 7.1 g/dL (ref 6.5–8.1)

## 2021-08-03 LAB — LIPASE, BLOOD: Lipase: 57 U/L — ABNORMAL HIGH (ref 11–51)

## 2021-08-03 MED ORDER — HYDROCODONE-ACETAMINOPHEN 5-325 MG PO TABS: 1.0000 | ORAL_TABLET | Freq: Four times a day (QID) | ORAL | 0 refills | Status: DC | PRN

## 2021-08-03 MED ORDER — METRONIDAZOLE 500 MG PO TABS: 500.0000 mg | ORAL_TABLET | Freq: Two times a day (BID) | ORAL | 0 refills | Status: DC

## 2021-08-03 MED ORDER — MORPHINE SULFATE (PF) 4 MG/ML IV SOLN
4.0000 mg | Freq: Once | INTRAVENOUS | Status: AC
  Administered 2021-08-03: 07:00:00 4 mg via INTRAVENOUS
  Filled 2021-08-03: qty 1

## 2021-08-03 MED ORDER — SODIUM CHLORIDE 0.9 % IV BOLUS
1000.0000 mL | Freq: Once | INTRAVENOUS | Status: AC
  Administered 2021-08-03: 07:00:00 1000 mL via INTRAVENOUS

## 2021-08-03 MED ORDER — CIPROFLOXACIN HCL 500 MG PO TABS: 500.0000 mg | ORAL_TABLET | Freq: Two times a day (BID) | ORAL | 0 refills | Status: AC

## 2021-08-03 MED ORDER — METRONIDAZOLE 500 MG PO TABS: 500.0000 mg | ORAL_TABLET | Freq: Three times a day (TID) | ORAL | 0 refills | Status: AC

## 2021-08-03 MED ORDER — IOHEXOL 300 MG/ML  SOLN
100.0000 mL | Freq: Once | INTRAMUSCULAR | Status: AC | PRN
  Administered 2021-08-03: 06:00:00 100 mL via INTRAVENOUS

## 2021-08-03 NOTE — ED Provider Notes (Signed)
[facility] EMERGENCY DEPARTMENT Provider Note   CSN: [id number] Arrival date & time: 08/02/21  2142     History Chief Complaint  Patient presents with   Abdominal Pain    Frederick Morales is a 65 y.o. male with a past medical history significant for type 2 diabetes, hepatitis C, hypertension, history of pancreatitis who presents to the ED due to left lower quadrant abdominal pain that is progressively worsened over the past 3 days.  Patient states pain worsened last night after he ate.  Patient states it feels similar to his past episode of diverticulitis.  Denies associated nausea, vomiting, diarrhea.  No melena, hematochezia, or hematemesis.  No fever or chills.  No urinary symptoms.  Endorses normal bowel movements.  Patient also has a history of pancreatitis.  Denies alcohol use.  Per chart review, patient has a remote history of alcohol abuse however, stopped numerous years ago.  Denies chronic NSAIDs.  No treatment prior to arrival.  No aggravating or alleviating factors.  History obtained from patient and past medical records. No interpreter used during encounter.       Past Medical History:  Diagnosis Date   Acute pancreatitis 06/21/2015   Arthritis    Blood transfusion    Diabetes mellitus    TYPE 2   Hepatitis C    Hep C, [place] and cleared in Jun 2016   Hypertension    MRSA (methicillin resistant Staphylococcus aureus)     Patient Active Problem List   Diagnosis Date Noted   Type 2 diabetes mellitus with hyperglycemia, without long-term current use of insulin (HCC)    DM (diabetes mellitus) type II controlled, neurological manifestation (HCC) 06/21/2015   Essential hypertension 06/21/2015   Acute pancreatitis 06/21/2015   Pancreatitis 06/20/2015   MRSA (methicillin resistant Staphylococcus aureus)    Hepatitis C     Past Surgical History:  Procedure Laterality Date   APPENDECTOMY     DENTAL SURGERY     TONSILLECTOMY         Family  History  Problem Relation Age of Onset   Diabetes Sister    Tuberculosis Maternal Grandmother     Social History   Tobacco Use   Smoking status: Former   Smokeless tobacco: Never   Tobacco comments:    quit smoking in the 1980"s  Vaping Use   Vaping Use: Never used  Substance Use Topics   Alcohol use: No    Comment: Quit 30 years ago   Drug use: No    Home Medications Prior to Admission medications   Medication Sig Start Date End Date Taking? Authorizing Provider  aspirin 81 MG chewable tablet Chew 81 mg by mouth daily. 04/21/12  Yes [provider]  Aspirin-Salicylamide-Caffeine (BC HEADACHE POWDER PO) Take 1 Package by mouth 2 (two) times daily as needed (headache).   Yes [provider]  atorvastatin (LIPITOR) 40 MG tablet Take 40 mg by mouth daily.   Yes [provider]  cholecalciferol (VITAMIN D3) 25 MCG (1000 UNIT) tablet Take 1,000 Units by mouth daily.   Yes [provider]  ciprofloxacin (CIPRO) 500 MG tablet Take 1 tablet (500 mg total) by mouth every 12 (twelve) hours for 10 days. 08/03/21 08/13/21 Yes Esperansa Sarabia C, PA-C  COCONUT OIL PO Take 1 tablet by mouth daily.   Yes [provider]  CONCERTA 27 MG CR tablet Take 27 mg by mouth every morning. 09/25/20  Yes [provider]  Flaxseed, Linseed, (FLAXSEED  OIL) 1000 MG CAPS Take 1,000 mg by mouth daily.   Yes [provider]  HYDROcodone-acetaminophen (NORCO/VICODIN) 5-325 MG tablet Take 1 tablet by mouth every 6 (six) hours as needed for severe pain. 08/03/21  Yes Dominique Calvey C, PA-C  lisinopril (ZESTRIL) 10 MG tablet Take 10 mg by mouth daily.   Yes [provider]  metFORMIN (GLUCOPHAGE) 1000 MG tablet Take 1,000 mg by mouth 2 (two) times daily with a meal.   Yes [provider]  Multiple Vitamins-Minerals (MULTIVITAMIN WITH MINERALS) tablet Take 1 tablet by mouth daily.   Yes [provider]  OVER THE COUNTER  MEDICATION Take 2 capsules by mouth 3 (three) times daily.   Yes [provider]  Potassium Citrate 99 MG CAPS Take 99 mg by mouth daily.   Yes [provider]  tamsulosin (FLOMAX) 0.4 MG CAPS capsule Take 0.4 mg by mouth daily.   Yes [provider]  vitamin C (ASCORBIC ACID) 500 MG tablet Take 500 mg by mouth daily.   Yes [provider]  ibuprofen (ADVIL) 600 MG tablet Take 1 tablet (600 mg total) by mouth every 6 (six) hours as needed for mild pain or moderate pain. Take with food. Patient not taking: Reported on 08/03/2021 11/21/20   Sabino Dick, DO  ibuprofen (ADVIL) 600 MG tablet TAKE 1 TABLET (600 MG TOTAL) BY MOUTH EVERY 6 (SIX) HOURS AS NEEDED FOR MILD PAIN OR MODERATE PAIN. TAKE WITH FOOD. Patient not taking: Reported on 08/03/2021 11/21/20 11/21/21  Sabino Dick, DO  ibuprofen (ADVIL) 600 MG tablet TAKE 1 TABLET (600 MG TOTAL) BY MOUTH EVERY SIX HOURS. Patient not taking: Reported on 08/03/2021 11/21/20 11/21/21  Sabino Dick, DO  metroNIDAZOLE (FLAGYL) 500 MG tablet Take 1 tablet (500 mg total) by mouth 3 (three) times daily for 10 days. 08/03/21 08/13/21  Mannie Stabile, PA-C  oxyCODONE (OXY IR/ROXICODONE) 5 MG immediate release tablet Take 1 tablet (5 mg total) by mouth every 4 (four) hours as needed for severe pain or breakthrough pain. Patient not taking: Reported on 08/03/2021 11/21/20   Sabino Dick, DO    Allergies    Penicillins  Review of Systems   Review of Systems  Constitutional:  Negative for chills and fever.  Respiratory:  Negative for shortness of breath.   Cardiovascular:  Negative for chest pain.  Gastrointestinal:  Positive for abdominal pain. Negative for diarrhea, nausea and vomiting.  Genitourinary:  Negative for dysuria.  Musculoskeletal:  Negative for back pain.  All other systems reviewed and are negative.  Physical Exam Updated Vital Signs BP 133/86   Pulse 64   Temp 98.6 F (37 C)    Resp 19   Ht 5\' 9"  (1.753 m)   Wt 73.9 kg   SpO2 100%   BMI 24.07 kg/m   Physical Exam Vitals and nursing note reviewed.  Constitutional:      General: He is not in acute distress.    Appearance: He is not ill-appearing.  HENT:     Head: Normocephalic.  Eyes:     Pupils: Pupils are equal, round, and reactive to light.  Cardiovascular:     Rate and Rhythm: Normal rate and regular rhythm.     Pulses: Normal pulses.     Heart sounds: Normal heart sounds. No murmur heard.   No friction rub. No gallop.  Pulmonary:     Effort: Pulmonary effort is normal.     Breath sounds: Normal breath sounds.  Abdominal:  General: Abdomen is flat. There is no distension.     Palpations: Abdomen is soft.     Tenderness: There is abdominal tenderness. There is no guarding or rebound.     Comments: Tenderness in LLQ. No rebound or guarding  Musculoskeletal:        General: Normal range of motion.     Cervical back: Neck supple.  Skin:    General: Skin is warm and dry.  Neurological:     General: No focal deficit present.     Mental Status: He is alert.  Psychiatric:        Mood and Affect: Mood normal.        Behavior: Behavior normal.    ED Results / Procedures / Treatments   Labs (all labs ordered are listed, but only abnormal results are displayed) Labs Reviewed  COMPREHENSIVE METABOLIC PANEL - Abnormal; Notable for the following components:      Result Value   Glucose, Bld 183 (*)    All other components within normal limits  LIPASE, BLOOD - Abnormal; Notable for the following components:   Lipase 57 (*)    All other components within normal limits  CBC WITH DIFFERENTIAL/PLATELET  URINALYSIS, ROUTINE W REFLEX MICROSCOPIC    EKG None  Radiology CT ABDOMEN PELVIS W CONTRAST  Result Date: 08/03/2021 CLINICAL DATA:  65 year old male with abdominal pain. Suspected acute diverticulitis. EXAM: CT ABDOMEN AND PELVIS WITH CONTRAST TECHNIQUE: Multidetector CT imaging of the  abdomen and pelvis was performed using the standard protocol following bolus administration of intravenous contrast. CONTRAST:  OMNIPAQUE IOHEXOL 300 MG/ML  SOLN COMPARISON:  CT the abdomen and pelvis 11/19/2020. FINDINGS: Lower chest: Atherosclerotic calcifications are noted in the descending thoracic aorta as well as the left anterior descending, left circumflex and right coronary arteries. Thickening calcification of the aortic valve. Hepatobiliary: Ill-defined intermediate attenuation (44 HU) lesion in segment 4A of the liver, incompletely characterized, but similar to remote prior examinations dating back to at least 2016, presumably benign. No other suspicious appearing hepatic lesions. No intra or extrahepatic biliary ductal dilatation. Gallbladder is normal in appearance. Pancreas: No pancreatic mass. No pancreatic ductal dilatation. No pancreatic or peripancreatic fluid collections or inflammatory changes. Spleen: Unremarkable. Adrenals/Urinary Tract: Bilateral kidneys and adrenal glands are normal in appearance. No hydroureteronephrosis. Urinary bladder is normal in appearance. Stomach/Bowel: The appearance of the stomach is normal. No pathologic dilatation of small bowel or colon. A few scattered colonic diverticulae are noted. Adjacent to the proximal sigmoid colon there are some subtle inflammatory changes, concerning for an acute diverticulitis. No discrete diverticular abscess or signs of frank perforation are noted at this time. The appendix is not confidently identified and may be surgically absent. Regardless, there are no inflammatory changes noted adjacent to the cecum to suggest the presence of an acute appendicitis at this time. Vascular/Lymphatic: Aortic atherosclerosis. No lymphadenopathy noted in the abdomen or pelvis. Reproductive: Prostate gland and seminal vesicles are unremarkable in appearance. Other: No significant volume of ascites.  No pneumoperitoneum. Musculoskeletal: There  are no aggressive appearing lytic or blastic lesions noted in the visualized portions of the skeleton. IMPRESSION: 1. Colonic diverticulosis with subtle inflammatory changes adjacent to the proximal sigmoid colon, concerning for an acute diverticulitis. No diverticular abscess or signs of frank perforation are noted at this time. 2. Aortic atherosclerosis, in addition to at least 3 vessel coronary artery disease. Please note that although the presence of coronary artery calcium documents the presence of coronary artery disease, the  severity of this disease and any potential stenosis cannot be assessed on this non-gated CT examination. Assessment for potential risk factor modification, dietary therapy or pharmacologic therapy may be warranted, if clinically indicated. 3. There are calcifications of the aortic valve. Echocardiographic correlation for evaluation of potential valvular dysfunction may be warranted if clinically indicated. 4. Additional incidental findings, as above. Electronically Signed   By: Trudie Reed M.D.   On: 08/03/2021 06:55    Procedures Procedures   Medications Ordered in ED Medications  iohexol (OMNIPAQUE) 300 MG/ML solution 100 mL (100 mLs Intravenous Contrast Given 08/03/21 0629)  morphine 4 MG/ML injection 4 mg (4 mg Intravenous Given 08/03/21 0646)  sodium chloride 0.9 % bolus 1,000 mL (1,000 mLs Intravenous New Bag/Given 08/03/21 1610)    ED Course  I have reviewed the triage vital signs and the nursing notes.  Pertinent labs & imaging results that were available during my care of the patient were reviewed by me and considered in my medical decision making (see chart for details).  Clinical Course as of 08/03/21 0841  Sun Aug 03, 2021  9604 Lipase(!): 89 [CA]    Clinical Course User Index [CA] Jesusita Oka   MDM Rules/Calculators/A&P                           65 year old male presents to the ED due to left lower quadrant abdominal pain that  started 3 days ago.  Previous history of pancreatitis and diverticulitis.  Upon arrival, stable vitals.  Patient in no acute distress.  Tenderness throughout left lower quadrant.  Unfortunately, patient waited over 8.5 hours prior to my initial evaluation due to long wait times.  Labs and CT abdomen ordered at triage.  Will give IV fluids and morphine for symptomatic relief.  UA negative for signs of infection.  Doubt pyelonephritis or acute cystitis.  CBC unremarkable.  No leukocytosis and normal hemoglobin.  CMP significant for mild hyperglycemia at 183 without signs of DKA.  Normal renal function.  No major electrolyte derangements.  Lipase mildly elevated at 57; however, patient checked into [facility] prior to arrival and left without being seen and at the time lipase was 167. It is reassuring lipase is downtrending. CT abdomen demonstrates diverticulitis without perforation or abscess formation. Incidental finding of coronary calcification. Patient made aware of results and advised to follow-up with PCP for further evaluation. No signs of pancreatitis on CT scan.  Patient able to tolerate po here in the ED. Discussed bowel rest with patient. Patient discharged with antibiotics and short course of pain medication. Advised patient to follow-up with his GI doctor within the next week for further evaluation. Strict ED precautions discussed with patient. Patient states understanding and agrees to plan. Patient discharged home in no acute distress and stable vitals.   Final Clinical Impression(s) / ED Diagnoses Final diagnoses:  Diverticulitis    Rx / DC Orders ED Discharge Orders          Ordered    metroNIDAZOLE (FLAGYL) 500 MG tablet  2 times daily,   Status:  Discontinued        08/03/21 0704    ciprofloxacin (CIPRO) 500 MG tablet  Every 12 hours        08/03/21 0704    metroNIDAZOLE (FLAGYL) 500 MG tablet  3 times daily        08/03/21 0706    HYDROcodone-acetaminophen (NORCO/VICODIN) 5-325 MG  tablet  Every 6 hours  PRN        08/03/21 0839             Mannie Stabile, PA-C 08/03/21 4765    Ernie Avena, MD 08/03/21 [phone]

## 2021-08-03 NOTE — Discharge Instructions (Addendum)
It was a pleasure taking care of you today. As discussed, your CT scan showed diverticulitis. I am sending you home with antibiotics. Take as prescribed and finish all antibiotics. Continue a liquid diet for the next few days and follow-up with your PCP within the next week. Return to the ER for new or worsening symptoms.   Your CT scan also showed some calcifications around your arteries and possibly a valve in your heart. Please follow-up with PCP for further evaluation.

## 2021-08-14 ENCOUNTER — Other Ambulatory Visit: Payer: Self-pay | Admitting: Orthopedic Surgery

## 2021-08-19 ENCOUNTER — Encounter
Admission: RE | Admit: 2021-08-19 | Discharge: 2021-08-19 | Disposition: A | Discharge status: Home / Self Care | Payer: Medicaid Other | Source: Ambulatory Visit | Attending: Orthopedic Surgery | Admitting: Orthopedic Surgery

## 2021-08-19 ENCOUNTER — Encounter: Payer: Self-pay | Admitting: Urgent Care

## 2021-08-19 ENCOUNTER — Other Ambulatory Visit: Payer: Self-pay

## 2021-08-19 ENCOUNTER — Other Ambulatory Visit
Admission: RE | Admit: 2021-08-19 | Discharge: 2021-08-19 | Disposition: A | Discharge status: Home / Self Care | Payer: Medicaid Other | Source: Ambulatory Visit | Attending: Orthopedic Surgery | Admitting: Orthopedic Surgery

## 2021-08-19 DIAGNOSIS — I10 Essential (primary) hypertension: Secondary | ICD-10-CM | POA: Insufficient documentation

## 2021-08-19 DIAGNOSIS — E119 Type 2 diabetes mellitus without complications: Secondary | ICD-10-CM | POA: Insufficient documentation

## 2021-08-19 DIAGNOSIS — Z01810 Encounter for preprocedural cardiovascular examination: Secondary | ICD-10-CM | POA: Insufficient documentation

## 2021-08-19 HISTORY — DX: Benign prostatic hyperplasia without lower urinary tract symptoms: N40.0

## 2021-08-19 HISTORY — DX: Hyperlipidemia, unspecified: E78.5

## 2021-08-19 HISTORY — DX: Primary generalized (osteo)arthritis: M15.0

## 2021-08-19 HISTORY — DX: Diverticulitis of intestine, part unspecified, without perforation or abscess without bleeding: K57.92

## 2021-08-19 HISTORY — DX: Polyosteoarthritis, unspecified: M15.9

## 2021-08-19 HISTORY — DX: Cardiac murmur, unspecified: R01.1

## 2021-08-19 NOTE — Patient Instructions (Addendum)
Your procedure is scheduled on: Thursday, December 22 Report to the Registration Desk on the 1st floor of the [facility] To find out your arrival time, please call [phone] between 1PM - 3PM on: Wednesday, December 21  REMEMBER: Instructions that are not followed completely may result in serious medical risk, up to and including death; or upon the discretion of your surgeon and anesthesiologist your surgery may need to be rescheduled.  Do not eat food after midnight the night before surgery.  No gum chewing, lozengers or hard candies.  You may however, drink water up to 2 hours before you are scheduled to arrive for your surgery. Do not drink anything within 2 hours of your scheduled arrival time.  TAKE THESE MEDICATIONS THE MORNING OF SURGERY WITH A SIP OF WATER:  Atorvastatin (Lipitor) Tamsulosin (Flomax)  Stop Metformin 2 days prior to surgery. Last day to take Metformin is Monday, December 19. Resume AFTER surgery.  One week prior to surgery: starting December 15 Stop aspirin and Anti-inflammatories (NSAIDS) such as Advil, Aleve, Ibuprofen, Motrin, Naproxen, Naprosyn and Aspirin based products such as Excedrin, Goodys Powder, BC Powder. Stop ANY OVER THE COUNTER supplements until after surgery. Stop vitamin D, coconut oil, multiple vitamin, vitamin C. You may however, continue to take Tylenol if needed for pain up until the day of surgery.  No Alcohol for 24 hours before or after surgery.  No Smoking including e-cigarettes for 24 hours prior to surgery.  No chewable tobacco products for at least 6 hours prior to surgery.  No nicotine patches on the day of surgery.  Do not use any "recreational" drugs for at least a week prior to your surgery.  Please be advised that the combination of cocaine and anesthesia may have negative outcomes, up to and including death. If you test positive for cocaine, your surgery will be cancelled.  On the morning of surgery brush your teeth  with toothpaste and water, you may rinse your mouth with mouthwash if you wish. Do not swallow any toothpaste or mouthwash.  Use CHG Soap as directed on instruction sheet.  Do not wear jewelry, make-up, hairpins, clips or nail polish.  Do not wear lotions, powders, or perfumes.   Do not shave body from the neck down 48 hours prior to surgery just in case you cut yourself which could leave a site for infection.  Also, freshly shaved skin may become irritated if using the CHG soap.  Contact lenses, hearing aids and dentures may not be worn into surgery.  Do not bring valuables to the hospital. [facility] is not responsible for any missing/lost belongings or valuables.   Notify your doctor if there is any change in your medical condition (cold, fever, infection).  Wear comfortable clothing (specific to your surgery type) to the hospital.  After surgery, you can help prevent lung complications by doing breathing exercises.  Take deep breaths and cough every 1-2 hours. Your doctor may order a device called an Incentive Spirometer to help you take deep breaths.  If you are being discharged the day of surgery, you will not be allowed to drive home. You will need a responsible adult (18 years or older) to drive you home and stay with you that night.   If you are taking public transportation, you will need to have a responsible adult (18 years or older) with you. Please confirm with your physician that it is acceptable to use public transportation.   Please call the Pre-admissions Testing Dept.  at [phone] if you have any questions about these instructions.  Surgery Visitation Policy:  Patients undergoing a surgery or procedure may have one family member or support person with them as long as that person is not COVID-19 positive or experiencing its symptoms.  That person may remain in the waiting area during the procedure and may rotate out with other people.

## 2021-08-28 ENCOUNTER — Ambulatory Visit: Payer: Medicaid Other

## 2021-08-28 ENCOUNTER — Ambulatory Visit: Payer: Medicaid Other | Admitting: Anesthesiology

## 2021-08-28 ENCOUNTER — Encounter: Payer: Self-pay | Admitting: Orthopedic Surgery

## 2021-08-28 ENCOUNTER — Ambulatory Visit
Admission: RE | Admit: 2021-08-28 | Discharge: 2021-08-28 | Disposition: A | Discharge status: Home / Self Care | Payer: Medicaid Other | Attending: Orthopedic Surgery | Admitting: Orthopedic Surgery

## 2021-08-28 ENCOUNTER — Encounter
Admission: RE | Disposition: A | Discharge status: Home / Self Care | Payer: Self-pay | Source: Home / Self Care | Attending: Orthopedic Surgery

## 2021-08-28 ENCOUNTER — Other Ambulatory Visit: Payer: Self-pay

## 2021-08-28 DIAGNOSIS — E119 Type 2 diabetes mellitus without complications: Secondary | ICD-10-CM | POA: Insufficient documentation

## 2021-08-28 DIAGNOSIS — I10 Essential (primary) hypertension: Secondary | ICD-10-CM | POA: Diagnosis not present

## 2021-08-28 DIAGNOSIS — E785 Hyperlipidemia, unspecified: Secondary | ICD-10-CM | POA: Insufficient documentation

## 2021-08-28 DIAGNOSIS — M1990 Unspecified osteoarthritis, unspecified site: Secondary | ICD-10-CM | POA: Diagnosis not present

## 2021-08-28 DIAGNOSIS — Z7984 Long term (current) use of oral hypoglycemic drugs: Secondary | ICD-10-CM | POA: Insufficient documentation

## 2021-08-28 DIAGNOSIS — X58XXXA Exposure to other specified factors, initial encounter: Secondary | ICD-10-CM | POA: Diagnosis not present

## 2021-08-28 DIAGNOSIS — Z419 Encounter for procedure for purposes other than remedying health state, unspecified: Secondary | ICD-10-CM

## 2021-08-28 DIAGNOSIS — S43431A Superior glenoid labrum lesion of right shoulder, initial encounter: Secondary | ICD-10-CM | POA: Diagnosis present

## 2021-08-28 DIAGNOSIS — Z880 Allergy status to penicillin: Secondary | ICD-10-CM | POA: Diagnosis not present

## 2021-08-28 DIAGNOSIS — Z87891 Personal history of nicotine dependence: Secondary | ICD-10-CM | POA: Diagnosis not present

## 2021-08-28 HISTORY — PX: SHOULDER ARTHROSCOPY WITH SUBACROMIAL DECOMPRESSION: SHX5684

## 2021-08-28 HISTORY — PX: SHOULDER ARTHROSCOPY WITH BICEPS TENDON REPAIR: SHX5674

## 2021-08-28 HISTORY — PX: SHOULDER ARTHROSCOPY WITH DISTAL CLAVICLE RESECTION: SHX5675

## 2021-08-28 LAB — GLUCOSE, CAPILLARY
Glucose-Capillary: 214 mg/dL — ABNORMAL HIGH (ref 70–99)
Glucose-Capillary: 161 mg/dL — ABNORMAL HIGH (ref 70–99)
Glucose-Capillary: 193 mg/dL — ABNORMAL HIGH (ref 70–99)

## 2021-08-28 SURGERY — SHOULDER ARTHROSCOPY WITH BICEPS TENDON REPAIR
Anesthesia: General | Site: Shoulder | Laterality: Right

## 2021-08-28 MED ORDER — ONDANSETRON HCL 4 MG/2ML IJ SOLN: 4.0000 mg | Freq: Once | INTRAMUSCULAR | Status: DC | PRN

## 2021-08-28 MED ORDER — MEPERIDINE HCL 25 MG/ML IJ SOLN: 6.2500 mg | INTRAMUSCULAR | Status: DC | PRN

## 2021-08-28 MED ORDER — INSULIN ASPART 100 UNIT/ML IJ SOLN: 5.0000 [IU] | Freq: Once | INTRAMUSCULAR | Status: AC

## 2021-08-28 MED ORDER — CHLORHEXIDINE GLUCONATE 0.12 % MT SOLN: 15.0000 mL | Freq: Once | OROMUCOSAL | Status: AC

## 2021-08-28 MED ORDER — EPHEDRINE SULFATE 50 MG/ML IJ SOLN
INTRAMUSCULAR | Status: DC | PRN
  Administered 2021-08-28: 5 mg via INTRAVENOUS
  Administered 2021-08-28: 10 mg via INTRAVENOUS

## 2021-08-28 MED ORDER — LIDOCAINE HCL (PF) 1 % IJ SOLN
INTRAMUSCULAR | Status: AC
  Filled 2021-08-28: qty 5

## 2021-08-28 MED ORDER — CLINDAMYCIN PHOSPHATE 600 MG/50ML IV SOLN
600.0000 mg | INTRAVENOUS | Status: AC
  Administered 2021-08-28: 08:00:00 600 mg via INTRAVENOUS

## 2021-08-28 MED ORDER — OXYCODONE HCL 5 MG PO TABS
5.0000 mg | ORAL_TABLET | ORAL | Status: DC | PRN
  Administered 2021-08-28: 12:00:00 5 mg via ORAL

## 2021-08-28 MED ORDER — BUPIVACAINE LIPOSOME 1.3 % IJ SUSP
INTRAMUSCULAR | Status: DC | PRN
  Administered 2021-08-28: 10 mL via PERINEURAL

## 2021-08-28 MED ORDER — ONDANSETRON HCL 4 MG PO TABS: 4.0000 mg | ORAL_TABLET | Freq: Three times a day (TID) | ORAL | 0 refills | Status: AC | PRN

## 2021-08-28 MED ORDER — FENTANYL CITRATE (PF) 100 MCG/2ML IJ SOLN: 25.0000 ug | INTRAMUSCULAR | Status: DC | PRN

## 2021-08-28 MED ORDER — PHENYLEPHRINE 40 MCG/ML (10ML) SYRINGE FOR IV PUSH (FOR BLOOD PRESSURE SUPPORT)
PREFILLED_SYRINGE | INTRAVENOUS | Status: DC | PRN
  Administered 2021-08-28: 40 ug via INTRAVENOUS
  Administered 2021-08-28: 80 ug via INTRAVENOUS
  Administered 2021-08-28: 30 ug via INTRAVENOUS
  Administered 2021-08-28: 40 ug via INTRAVENOUS
  Administered 2021-08-28: 60 ug via INTRAVENOUS
  Administered 2021-08-28: 20 ug via INTRAVENOUS

## 2021-08-28 MED ORDER — OXYCODONE HCL 5 MG PO TABS
ORAL_TABLET | ORAL | Status: AC
  Filled 2021-08-28: qty 1

## 2021-08-28 MED ORDER — DEXAMETHASONE SODIUM PHOSPHATE 10 MG/ML IJ SOLN
INTRAMUSCULAR | Status: DC | PRN
Start: 2021-08-28 — End: 2021-08-28
  Administered 2021-08-28: 10 mg via INTRAVENOUS

## 2021-08-28 MED ORDER — LACTATED RINGERS IR SOLN
Status: DC | PRN
  Administered 2021-08-28: 3000 mL

## 2021-08-28 MED ORDER — ACETAMINOPHEN 500 MG PO TABS
1000.0000 mg | ORAL_TABLET | ORAL | Status: AC
  Administered 2021-08-28: 07:00:00 1000 mg via ORAL

## 2021-08-28 MED ORDER — SUCCINYLCHOLINE CHLORIDE 200 MG/10ML IV SOSY
PREFILLED_SYRINGE | INTRAVENOUS | Status: DC | PRN
  Administered 2021-08-28: 120 mg via INTRAVENOUS

## 2021-08-28 MED ORDER — FAMOTIDINE 20 MG PO TABS: 20.0000 mg | ORAL_TABLET | Freq: Once | ORAL | Status: AC

## 2021-08-28 MED ORDER — GLYCOPYRROLATE 0.2 MG/ML IJ SOLN
INTRAMUSCULAR | Status: DC | PRN
  Administered 2021-08-28: .1 mg via INTRAVENOUS

## 2021-08-28 MED ORDER — MIDAZOLAM HCL 2 MG/2ML IJ SOLN
INTRAMUSCULAR | Status: AC
  Administered 2021-08-28: 08:00:00 2 mg via INTRAVENOUS
  Filled 2021-08-28: qty 2

## 2021-08-28 MED ORDER — BUPIVACAINE HCL (PF) 0.5 % IJ SOLN: INTRAMUSCULAR | Status: DC | PRN

## 2021-08-28 MED ORDER — CHLORHEXIDINE GLUCONATE CLOTH 2 % EX PADS: 6.0000 | MEDICATED_PAD | Freq: Once | CUTANEOUS | Status: DC

## 2021-08-28 MED ORDER — SUGAMMADEX SODIUM 200 MG/2ML IV SOLN
INTRAVENOUS | Status: DC | PRN
  Administered 2021-08-28: 100 mg via INTRAVENOUS
  Administered 2021-08-28: 200 mg via INTRAVENOUS

## 2021-08-28 MED ORDER — FAMOTIDINE 20 MG PO TABS
ORAL_TABLET | ORAL | Status: AC
  Administered 2021-08-28: 07:00:00 20 mg via ORAL
  Filled 2021-08-28: qty 1

## 2021-08-28 MED ORDER — EPINEPHRINE PF 1 MG/ML IJ SOLN
INTRAMUSCULAR | Status: AC
  Filled 2021-08-28: qty 8

## 2021-08-28 MED ORDER — BUPIVACAINE HCL (PF) 0.5 % IJ SOLN
INTRAMUSCULAR | Status: AC
  Filled 2021-08-28: qty 10

## 2021-08-28 MED ORDER — SODIUM CHLORIDE 0.9 % IV SOLN: INTRAVENOUS | Status: DC

## 2021-08-28 MED ORDER — BUPIVACAINE LIPOSOME 1.3 % IJ SUSP
INTRAMUSCULAR | Status: AC
  Filled 2021-08-28: qty 10

## 2021-08-28 MED ORDER — PROPOFOL 10 MG/ML IV BOLUS
INTRAVENOUS | Status: DC | PRN
  Administered 2021-08-28: 150 mg via INTRAVENOUS
  Administered 2021-08-28: 30 mg via INTRAVENOUS

## 2021-08-28 MED ORDER — INSULIN ASPART 100 UNIT/ML IJ SOLN
INTRAMUSCULAR | Status: AC
  Administered 2021-08-28: 07:00:00 5 [IU] via SUBCUTANEOUS
  Filled 2021-08-28: qty 5

## 2021-08-28 MED ORDER — BUPIVACAINE HCL (PF) 0.5 % IJ SOLN
INTRAMUSCULAR | Status: DC | PRN
  Administered 2021-08-28: 20 mL via PERINEURAL

## 2021-08-28 MED ORDER — ONDANSETRON HCL 4 MG/2ML IJ SOLN
INTRAMUSCULAR | Status: DC | PRN
  Administered 2021-08-28: 4 mg via INTRAVENOUS

## 2021-08-28 MED ORDER — CHLORHEXIDINE GLUCONATE 0.12 % MT SOLN
OROMUCOSAL | Status: AC
  Administered 2021-08-28: 07:00:00 15 mL via OROMUCOSAL
  Filled 2021-08-28: qty 15

## 2021-08-28 MED ORDER — LIDOCAINE HCL (CARDIAC) PF 100 MG/5ML IV SOSY
PREFILLED_SYRINGE | INTRAVENOUS | Status: DC | PRN
  Administered 2021-08-28: 60 mg via INTRAVENOUS

## 2021-08-28 MED ORDER — ROCURONIUM BROMIDE 100 MG/10ML IV SOLN
INTRAVENOUS | Status: DC | PRN
  Administered 2021-08-28: 20 mg via INTRAVENOUS
  Administered 2021-08-28: 50 mg via INTRAVENOUS

## 2021-08-28 MED ORDER — FENTANYL CITRATE PF 50 MCG/ML IJ SOSY: 100.0000 ug | PREFILLED_SYRINGE | Freq: Once | INTRAMUSCULAR | Status: AC

## 2021-08-28 MED ORDER — OXYCODONE HCL 5 MG PO TABS: 5.0000 mg | ORAL_TABLET | ORAL | 0 refills | Status: AC | PRN

## 2021-08-28 MED ORDER — FENTANYL CITRATE PF 50 MCG/ML IJ SOSY
PREFILLED_SYRINGE | INTRAMUSCULAR | Status: AC
  Administered 2021-08-28: 08:00:00 50 ug via INTRAVENOUS
  Filled 2021-08-28: qty 1

## 2021-08-28 MED ORDER — ORAL CARE MOUTH RINSE: 15.0000 mL | Freq: Once | OROMUCOSAL | Status: AC

## 2021-08-28 MED ORDER — MIDAZOLAM HCL 2 MG/2ML IJ SOLN: 2.0000 mg | Freq: Once | INTRAMUSCULAR | Status: AC

## 2021-08-28 MED ORDER — LACTATED RINGERS IR SOLN: Status: DC | PRN

## 2021-08-28 SURGICAL SUPPLY — 70 items
ADAPTER IRRIG TUBE 2 SPIKE SOL (ADAPTER) ×4 IMPLANT
ADPR TBG 2 SPK PMP STRL ASCP (ADAPTER) ×1
ANCH SUT 2.9 PUSHLOCK ANCH (Orthopedic Implant) ×1 IMPLANT
BLADE SHAVER 4.5X7 STR FR (MISCELLANEOUS) ×1 IMPLANT
BUR BR 5.5 WIDE MOUTH (BURR) IMPLANT
CANISTER SUCT LVC 12 LTR MEDI- (MISCELLANEOUS) IMPLANT
CANNULA 5.75X7 CRYSTAL CLEAR (CANNULA) ×4 IMPLANT
CANNULA PARTIAL THREAD 2X7 (CANNULA) ×3 IMPLANT
CANNULA TWIST IN 8.25X9CM (CANNULA) IMPLANT
CLOSURE WOUND 1/2 X4 (GAUZE/BANDAGES/DRESSINGS) ×1
COOLER POLAR GLACIER W/PUMP (MISCELLANEOUS) ×3 IMPLANT
DEVICE SUCT BLK HOLE OR FLOOR (MISCELLANEOUS) IMPLANT
DRAPE 3/4 80X56 (DRAPES) ×3 IMPLANT
DRAPE IMP U-DRAPE 54X76 (DRAPES) ×6 IMPLANT
DRAPE INCISE IOBAN 66X45 STRL (DRAPES) ×3 IMPLANT
DRAPE U-SHAPE 47X51 STRL (DRAPES) ×2 IMPLANT
DURAPREP 26ML APPLICATOR (WOUND CARE) ×11 IMPLANT
ELECT REM PT RETURN 9FT ADLT (ELECTROSURGICAL)
ELECTRODE REM PT RTRN 9FT ADLT (ELECTROSURGICAL) IMPLANT
GAUZE SPONGE 4X4 12PLY STRL (GAUZE/BANDAGES/DRESSINGS) ×3 IMPLANT
GAUZE XEROFORM 1X8 LF (GAUZE/BANDAGES/DRESSINGS) ×3 IMPLANT
GLOVE SURG ORTHO LTX SZ9 (GLOVE) ×6 IMPLANT
GLOVE SURG UNDER POLY LF SZ9 (GLOVE) ×3 IMPLANT
GOWN STRL REUS TWL 2XL XL LVL4 (GOWN DISPOSABLE) ×3 IMPLANT
GOWN STRL REUS W/ TWL LRG LVL3 (GOWN DISPOSABLE) ×1 IMPLANT
GOWN STRL REUS W/ TWL LRG LVL4 (GOWN DISPOSABLE) ×1 IMPLANT
GOWN STRL REUS W/TWL LRG LVL3 (GOWN DISPOSABLE) ×3
GOWN STRL REUS W/TWL LRG LVL4 (GOWN DISPOSABLE) ×3
GUIDEWIRE 1.2MMX18 (WIRE) ×2 IMPLANT
IV LACTATED RINGER IRRG 3000ML (IV SOLUTION) ×15
IV LR IRRIG 3000ML ARTHROMATIC (IV SOLUTION) ×6 IMPLANT
KIT STABILIZATION SHOULDER (MISCELLANEOUS) ×3 IMPLANT
KIT SUTURETAK 3.0 INSERT PERC (KITS) ×1 IMPLANT
KIT TURNOVER KIT A (KITS) ×3 IMPLANT
MANIFOLD NEPTUNE II (INSTRUMENTS) ×4 IMPLANT
MASK FACE SPIDER DISP (MASK) ×3 IMPLANT
MAT ABSORB  FLUID 56X50 GRAY (MISCELLANEOUS) ×4
MAT ABSORB FLUID 56X50 GRAY (MISCELLANEOUS) ×2 IMPLANT
NEEDLE HYPO 22GX1.5 SAFETY (NEEDLE) ×1 IMPLANT
PACK ARTHROSCOPY SHOULDER (MISCELLANEOUS) ×3 IMPLANT
PAD ABD DERMACEA PRESS 5X9 (GAUZE/BANDAGES/DRESSINGS) ×2 IMPLANT
PAD ARMBOARD 7.5X6 YLW CONV (MISCELLANEOUS) ×5 IMPLANT
PAD WRAPON POLAR SHDR XLG (MISCELLANEOUS) ×1 IMPLANT
SHAVER BLADE BONE CUTTER  5.5 (BLADE)
SHAVER BLADE BONE CUTTER 4.5 (BLADE) ×2 IMPLANT
SHAVER BLADE BONE CUTTER 5.5 (BLADE) ×1 IMPLANT
SHAVER BLADE TAPERED BLUNT 4 (BLADE) ×2 IMPLANT
SPONGE T-LAP 18X18 ~~LOC~~+RFID (SPONGE) ×3 IMPLANT
STRAP SAFETY 5IN WIDE (MISCELLANEOUS) ×3 IMPLANT
STRIP CLOSURE SKIN 1/2X4 (GAUZE/BANDAGES/DRESSINGS) ×2 IMPLANT
SUT ETHILON 4-0 (SUTURE) ×3
SUT ETHILON 4-0 FS2 18XMFL BLK (SUTURE) ×1
SUT LASSO 90 DEG SD STR (SUTURE) ×1 IMPLANT
SUT MNCRL 4-0 (SUTURE)
SUT MNCRL 4-0 27XMFL (SUTURE)
SUT PDS AB 0 CT1 27 (SUTURE) ×2 IMPLANT
SUT ULTRABRAID 2 COBRAID 38 (SUTURE) IMPLANT
SUT VIC AB 0 CT1 36 (SUTURE) IMPLANT
SUT VIC AB 2-0 CT2 27 (SUTURE) IMPLANT
SUTURE ETHLN 4-0 FS2 18XMF BLK (SUTURE) ×1 IMPLANT
SUTURE MNCRL 4-0 27XMF (SUTURE) ×1 IMPLANT
SYSTEM IMPL TENODESIS LNT 2.9 (Orthopedic Implant) ×2 IMPLANT
TAPE MICROFOAM 4IN (TAPE) ×3 IMPLANT
TUBING CONNECTING 10 (TUBING) ×2 IMPLANT
TUBING CONNECTING 10' (TUBING) ×1
TUBING INFLOW SET DBFLO PUMP (TUBING) ×3 IMPLANT
TUBING OUTFLOW SET DBLFO PUMP (TUBING) ×3 IMPLANT
WAND WEREWOLF FLOW 90D (MISCELLANEOUS) ×3 IMPLANT
WATER STERILE IRR 500ML POUR (IV SOLUTION) ×3 IMPLANT
WRAPON POLAR PAD SHDR XLG (MISCELLANEOUS) ×3

## 2021-08-28 NOTE — H&P (Addendum)
PREOPERATIVE H&P  Chief Complaint: Right Shoulder SLAP Tear  HPI: Frederick Morales is a 65 y.o. male who presents for preoperative history and physical with a diagnosis of Right Shoulder SLAP Tear confirmed by MR arthrogram. Symptoms of right shoulder pain with reaching, abduction and overhead motion are significantly impairing activities of daily living.  Patient is failed nonoperative management wished to proceed with surgical fixation of the superior labrum versus biceps tenodesis.  Past Medical History:  Diagnosis Date   Acute pancreatitis 06/21/2015   Arthritis    Benign prostate hyperplasia    Blood transfusion    Diabetes mellitus    TYPE 2   Diverticulitis    Heart murmur    Hepatitis C    Hep C, Harvoni and cleared in Jun 2016   Hyperlipidemia    Hypertension    MRSA (methicillin resistant Staphylococcus aureus)    skin infections   Primary osteoarthritis involving multiple joints    Past Surgical History:  Procedure Laterality Date   APPENDECTOMY     COLONOSCOPY  07/06/2019   COLONOSCOPY  01/18/2015   DENTAL SURGERY  10/18/2014   TONSILLECTOMY     Social History   Socioeconomic History   Marital status: Single    Spouse name: Not on file   Number of children: Not on file   Years of education: Not on file   Highest education level: Not on file  Occupational History   Not on file  Tobacco Use   Smoking status: Former    Types: Cigarettes    Quit date: 1980    Years since quitting: 43.0   Smokeless tobacco: Never   Tobacco comments:    quit smoking in the 1980"s  Vaping Use   Vaping Use: Never used  Substance and Sexual Activity   Alcohol use: No    Comment: Quit 30 years ago   Drug use: Yes    Types: Marijuana   Sexual activity: Not on file  Other Topics Concern   Not on file  Social History Narrative   Lives with mother   Social Determinants of Health   Financial Resource Strain: Not on file  Food Insecurity: Not on file  Transportation  Needs: Not on file  Physical Activity: Not on file  Stress: Not on file  Social Connections: Not on file   Family History  Problem Relation Age of Onset   Diabetes Sister    Tuberculosis Maternal Grandmother    Allergies  Allergen Reactions   Penicillins Swelling    Facial swelling Has patient had a PCN reaction causing immediate rash, facial/tongue/throat swelling, SOB or lightheadedness with hypotension: No Haspatient had a PCN reaction causing severe rash involving mucus membranes or skin necrosis/No Has patient had a PCN reaction that required hospitalization /No Has patient had a PCN reaction occurring within the last 10 years: No If all of the above answers are "NO", then may proceed with Cephalosporin use.    Prior to Admission medications   Medication Sig Start Date End Date Taking? Authorizing Provider  aspirin 81 MG chewable tablet Chew 81 mg by mouth daily. 04/21/12  Yes [provider]  Aspirin-Salicylamide-Caffeine (BC HEADACHE POWDER PO) Take 1 Package by mouth 2 (two) times daily as needed (headache).   Yes [provider]  atorvastatin (LIPITOR) 40 MG tablet Take 40 mg by mouth daily.   Yes [provider]  cholecalciferol (VITAMIN D3) 25 MCG (1000 UNIT) tablet Take 1,000 Units by mouth daily.   Yes  [provider]  COCONUT OIL PO Take 1 tablet by mouth daily.   Yes [provider]  CONCERTA 27 MG CR tablet Take 27 mg by mouth every morning. 09/25/20  Yes [provider]  lisinopril (ZESTRIL) 10 MG tablet Take 10 mg by mouth daily.   Yes [provider]  metFORMIN (GLUCOPHAGE) 1000 MG tablet Take 1,000 mg by mouth 2 (two) times daily with a meal.   Yes [provider]  Multiple Vitamins-Minerals (MULTIVITAMIN WITH MINERALS) tablet Take 1 tablet by mouth daily.   Yes [provider]  Potassium Citrate 99 MG CAPS Take 99 mg by mouth daily.   Yes [provider]  tamsulosin (FLOMAX)  0.4 MG CAPS capsule Take 0.4 mg by mouth daily.   Yes [provider]  vitamin C (ASCORBIC ACID) 500 MG tablet Take 500 mg by mouth daily.   Yes [provider]     Positive ROS: All other systems have been reviewed and were otherwise negative with the exception of those mentioned in the HPI and as above.  Physical Exam: General: Alert, no acute distress Cardiovascular: Regular rate and rhythm, no murmurs rubs or gallops.  No pedal edema Respiratory: Clear to auscultation bilaterally, no wheezes rales or rhonchi. No cyanosis, no use of accessory musculature GI: No organomegaly, abdomen is soft and non-tender nondistended with positive bowel sounds. Skin: Skin intact, no lesions within the operative field. Neurologic: Sensation intact distally Psychiatric: Patient is competent for consent with normal mood and affect Lymphatic: No cervical lymphadenopathy  MUSCULOSKELETAL: Right Shoulder: The patient can forward elevate and abduct to approximately 140-150 degrees. He has positive impingement signs, but no apprehension or instability. He has no obvious weakness of the rotator cuff, but has pain with Speed's and O'Brien's test. He has full digital, wrist and elbow range of motion, intact sensation to light touch and a palpable radial pulse.   Assessment: Right Shoulder SLAP Tear  Plan: Plan for Procedure(s): RIGHT SHOULDER ARTHROSCOPY WITH SLAP REPAIR VS BICEPS TENODESIS  I reviewed the details of the operation as well as the postoperative course with the patient.  A preop history and physical was performed at the bedside.  I marked the right shoulder according to the hospital's correct site of the surgery protocol after verbally confirming with the patient that this was the correct site of surgery.  He received a interscalene block with Exparel by the anesthesia service in the preoperative area.  I informed the patient that his MR arthrogram shows a tear of the superior  labrum extending into the biceps tendon.  Therefore I explained that he will likely need a biceps tenodesis.  I will evaluate the rotator cuff at the time of surgery as well as he had evidence of a low-grade partial tear on his MR arthrogram.  I informed him that I would arthroscopically evaluate his entire shoulder during the case and would fix any pathology that is encountered that may be contributing to his pain.  Patient understood and agreed with this plan.  I discussed the risks and benefits of surgery. The risks include but are not limited to infection, bleeding, nerve or blood vessel injury, joint stiffness or loss of motion, persistent pain, weakness or instability, retear of the labrum or biceps, Popeye deformity and hardware failure and the need for further surgery.  Patient understood these risks and wished to proceed.    Juanell Fairly, MD   08/28/2021 8:10 AM

## 2021-08-28 NOTE — Anesthesia Postprocedure Evaluation (Signed)
Anesthesia Post Note  Patient: Frederick Morales  Procedure(s) Performed: SHOULDER ARTHROSCOPY WITH BICEPS TENODESIS (Right: Shoulder) SHOULDER ARTHROSCOPY WITH SUBACROMIAL DECOMPRESSION (Right: Shoulder) SHOULDER ARTHROSCOPY WITH DISTAL CLAVICLE EXCISION (Right: Shoulder)  Patient location during evaluation: PACU Anesthesia Type: General and Regional Level of consciousness: awake and alert, awake and oriented Pain management: pain level controlled Vital Signs Assessment: post-procedure vital signs reviewed and stable Respiratory status: spontaneous breathing, nonlabored ventilation and respiratory function stable Cardiovascular status: blood pressure returned to baseline and stable Postop Assessment: no apparent nausea or vomiting Anesthetic complications: no   No notable events documented.   Last Vitals:  Vitals:   08/28/21 1100 08/28/21 1126  BP: 131/76 (!) 162/97  Pulse: 67 (!) 58  Resp: 19 16  Temp:  (!) 36.1 C  SpO2: 97% 100%    Last Pain:  Vitals:   08/28/21 1148  TempSrc: Temporal  PainSc:                  Manfred Arch

## 2021-08-28 NOTE — Progress Notes (Signed)
Accidental, this did not occur

## 2021-08-28 NOTE — Op Note (Signed)
08/28/2021  10:24 AM  PATIENT:  Frederick Morales    PRE-OPERATIVE DIAGNOSIS:  Right Shoulder SLAP Tear  POST-OPERATIVE DIAGNOSIS:  Same  PROCEDURE:  SHOULDER ARTHROSCOPY WITH BICEPS TENODESIS, SHOULDER ARTHROSCOPY WITH SUBACROMIAL DECOMPRESSION, SHOULDER ARTHROSCOPY WITH DISTAL CLAVICLE EXCISION  SURGEON:  Thornton Park, MD  ANESTHESIA:   General with paracervical block  OPERATIVE IMPLANTS: Arthrex push lock anchor x1   OPERATIVE FINDINGS: Type II SLAP tear with unstable biceps anchor, subacromial impingement and acromioclavicular joint arthrosis   PREOPERATIVE INDICATIONS:  Frederick Morales is a  65 y.o. male with a diagnosis of Right Shoulder SLAP Tear who failed conservative measures and elected for surgical management.    I discussed the risks and benefits of surgery. The risks include but are not limited to infection, bleeding requiring blood transfusion, nerve or blood vessel injury, joint stiffness or loss of motion, persistent pain, weakness or instability, malunion, nonunion and hardware failure and the need for further surgery. Medical risks include but are not limited to DVT and pulmonary embolism, myocardial infarction, stroke, pneumonia, respiratory failure and death. Patient understood these risks and wished to proceed.   OPERATIVE PROCEDURE: The patient was met in the preoperative area. The right shoulder was signed with the word yes and my initials according the hospital's correct site of surgery protocol.  A preop history and physical was performed at the bedside.  I reviewed the details of the operation and the postoperative course with the patient.  The patient underwent placement of a right interscalene block with Exparel by the anesthesia service.   The patient was then brought to the operating room where he underwent general endotracheal intubation.  The patient was placed in a beachchair position.  A spider arm positioner was used for this case. Examination under  anesthesia revealed no limitation of motion or instability with load shift testing. The patient had a negative sulcus sign.   The patient was prepped and draped in a sterile fashion. A timeout was performed to verify the patient's name, date of birth, medical record number, correct site of surgery and correct procedure to be performed there was also used to verify the patient received antibiotics that all appropriate instruments, implants and radiographs studies were available in the room. Once all in attendance were in agreement case began.   Bony landmarks were drawn out with a surgical marker along with proposed arthroscopy incisions.  An 11 blade was used to establish a posterior portal through which the arthroscope was placed in the glenohumeral joint. A full diagnostic examination of the shoulder was performed.  the anterior portal was established under direct visualization with an 18-gauge spinal needle. A 5.75 the medial arthroscopic cannula was placed through this anterior portal.    The patient was found to have a type II SLAP tear with an unstable biceps anchor and therefore the decision was made to perform a arthroscopic biceps tenodesis.  The anterior cannula was switched for a 7.0 mm anterior cannula.  An Arthrex looped suture was passed around the biceps tendon and brought out through the anterior cannula.  The end of the suture was placed through the loop performing a luggage tag around the biceps tendon.  The suture tail was then passed through the tendon and again brought out through the anterior portal securing the luggage tag suture configuration.  A drill was then passed through the anterior portal and a drill hole was made at the top of the intertuberous groove.  The biceps suture was then  loaded into a push lock anchor and this was carefully malleted into place into the drill hole completing the biceps tenodesis.  A 90 degree werewolf wand was used to "mushroom" the cut end of the biceps  tendon to prevent the luggage tag from slipping over the end of the tendon.  The biceps stump on the superior labrum was also debrided with a 90 degree ArthroCare wand.  The frayed edges of the superior labrum was debrided with a Dyonics flyer shaver blade.  Final arthroscopic images were taken of the biceps tenodesis.   The arthroscope was then placed in the subacromial space. A lateral portal was then established using an 18-gauge spinal needle for localization. Extensive bursitis was encountered and debrided using a 4.0 Dyonics flyer shaver blade and a [place] werewolf wand from the lateral portal. A subacromial decompression was also performed using a 4.5 mm bone cutter shaver blade from the lateral portal and a distal clavicle excision was performed from the anterior portal using this same shaver blade.  Final arthroscopic images were taken.   All incisions were copiously irrigated. The deltoid fascia was repaired using a 0 Vicryl suture in an interrupted fashion. The subcutaneous tissue of all incisions were closed with a 2-0 Vicryl. Skin closure for the arthroscopic incisions was performed with 4-0 nylon. The skin edges of the saber incision were approximated with a running 4-0 undyed Monocryl.  A dry sterile dressing was applied.  The patient was placed in an abduction sling, with a Polar Care sleeve.   All sharp and instrument counts were correct at the conclusion of the case. I was scrubbed and present for the entire case. I spoke with the patient's friend postoperatively to let her know the case had been performed without complication and the patient was stable in recovery room.   I reviewed the postoperative instructions with her and answered all her questions.

## 2021-08-28 NOTE — Transfer of Care (Addendum)
Immediate Anesthesia Transfer of Care Note  Patient: Frederick Morales  Procedure(s) Performed: SHOULDER ARTHROSCOPY WITH BICEPS TENODESIS (Right: Shoulder) SHOULDER ARTHROSCOPY WITH SUBACROMIAL DECOMPRESSION (Right: Shoulder) SHOULDER ARTHROSCOPY WITH DISTAL CLAVICLE EXCISION (Right: Shoulder)  Patient Location: PACU  Anesthesia Type:GA combined with regional for post-op pain  Level of Consciousness: drowsy  Airway & Oxygen Therapy: Patient connected to face mask  Post-op Assessment: Report given to RN  Post vital signs: stable  Last Vitals:  Vitals Value Taken Time  BP    Temp    Pulse    Resp    SpO2      Last Pain:  Vitals:   08/28/21 0638  TempSrc: Oral  PainSc: 7          Complications: No notable events documented.

## 2021-08-28 NOTE — Anesthesia Procedure Notes (Signed)
Procedure Name: Intubation Date/Time: 08/28/2021 8:23 AM Performed by: Kerri Perches, CRNA Pre-anesthesia Checklist: Patient identified, Patient being monitored, Timeout performed, Emergency Drugs available and Suction available Patient Re-evaluated:Patient Re-evaluated prior to induction Oxygen Delivery Method: Circle system utilized Preoxygenation: Pre-oxygenation with 100% oxygen Induction Type: IV induction Ventilation: Mask ventilation without difficulty Laryngoscope Size: Mac, 3 and McGraph Grade View: Grade I Tube type: Oral Tube size: 7.0 mm Number of attempts: 1 Airway Equipment and Method: Stylet Placement Confirmation: ETT inserted through vocal cords under direct vision, positive ETCO2 and breath sounds checked- equal and bilateral Secured at: 22 cm Tube secured with: Tape Dental Injury: Teeth and Oropharynx as per pre-operative assessment

## 2021-08-28 NOTE — Anesthesia Preprocedure Evaluation (Signed)
Anesthesia Evaluation  Patient identified by MRN, date of birth, ID band Patient awake    Reviewed: Allergy & Precautions, NPO status , Patient's Chart, lab work & pertinent test results  Airway Mallampati: II       Dental  (+) Edentulous Upper, Edentulous Lower   Pulmonary neg pulmonary ROS, former smoker,    Pulmonary exam normal        Cardiovascular hypertension, Pt. on medications Normal cardiovascular exam+ Valvular Problems/Murmurs      Neuro/Psych negative neurological ROS  negative psych ROS   GI/Hepatic negative GI ROS, (+) Hepatitis - (Treated), C  Endo/Other  negative endocrine ROSdiabetes, Well Controlled, Oral Hypoglycemic Agents  Renal/GU negative Renal ROS  negative genitourinary   Musculoskeletal  (+) Arthritis , Osteoarthritis,    Abdominal   Peds negative pediatric ROS (+)  Hematology negative hematology ROS (+)   Anesthesia Other Findings Acute pancreatitis 06/21/2015  Arthritis    Benign prostate hyperplasia   Blood transfusion    Diabetes mellitus  TYPE 2 Diverticulitis    Heart murmur    Hepatitis C  Hep C, Harvoni and cleared in Jun 2016  Hyperlipidemia    Hypertension    MRSA (methicillin resistant Staphylococcus aureus)  skin infections  Primary osteoarthritis involving multiple joints       Reproductive/Obstetrics negative OB ROS                            Anesthesia Physical Anesthesia Plan  ASA: 3  Anesthesia Plan: General   Post-op Pain Management: Regional block   Induction: Intravenous  PONV Risk Score and Plan: 2 and Propofol infusion, Ondansetron and Midazolam  Airway Management Planned: Oral ETT  Additional Equipment:   Intra-op Plan:   Post-operative Plan: Extubation in OR  Informed Consent: I have reviewed the patients History and Physical, chart, labs and discussed the procedure including the risks, benefits and alternatives for  the proposed anesthesia with the patient or authorized representative who has indicated his/her understanding and acceptance.       Plan Discussed with: CRNA, Anesthesiologist and Surgeon  Anesthesia Plan Comments:         Anesthesia Quick Evaluation

## 2021-08-28 NOTE — Anesthesia Procedure Notes (Signed)
Anesthesia Regional Block: Interscalene brachial plexus block   Pre-Anesthetic Checklist: , timeout performed,  Correct Patient, Correct Site, Correct Laterality,  Correct Procedure, Correct Position, site marked,  Risks and benefits discussed,  Surgical consent,  Pre-op evaluation,  At surgeon's request and post-op pain management  Laterality: Upper and Right  Prep: chloraprep       Needles:  Injection technique: Single-shot  Needle Type: Echogenic Stimulator Needle     Needle Length: 9cm  Needle Gauge: 22   Needle insertion depth: 3 cm   Additional Needles:   Procedures:,,,, ultrasound used (permanent image in chart),,   Motor weakness within 3 minutes.   Nerve Stimulator or Paresthesia:  Response: R Forearm, 0.6 mA, 2 ms, 3 cm  Additional Responses:   Narrative:  Start time: 08/28/2021 7:33 AM End time: 08/28/2021 7:39 AM Injection made incrementally with aspirations every 5 mL.  Performed by: Personally  Anesthesiologist: Manfred Arch, MD  Additional Notes: Patient consented for risk and benefits of nerve block including but not limited to nerve damage, failed block, bleeding and infection.  Patient voiced understanding.  Functioning IV was confirmed and monitors were applied.  Timeout done prior to procedure and prior to any sedation being given to the patient.  Patient confirmed procedure site prior to any sedation given to the patient.  A 24mm 22ga Stimuplex needle was used. Sterile prep,hand hygiene and sterile gloves were used.  Minimal sedation used for procedure. IVCS with Versed 2 mg + Fent 50 mcg.  No paresthesia endorsed by patient during the procedure.  Negative aspiration and negative test dose prior to incremental administration of local anesthetic. The patient tolerated the procedure well with no immediate complications.  Injectate:  0.5% Bupivacaine 20 ml + Exparel 10 ml.

## 2021-08-28 NOTE — Discharge Instructions (Addendum)
AMBULATORY SURGERY  DISCHARGE INSTRUCTIONS   The drugs that you were given will stay in your system until tomorrow so for the next 24 hours you should not:  Drive an automobile Make any legal decisions Drink any alcoholic beverage   You may resume regular meals tomorrow.  Today it is better to start with liquids and gradually work up to solid foods.  You may eat anything you prefer, but it is better to start with liquids, then soup and crackers, and gradually work up to solid foods.   Please notify your doctor immediately if you have any unusual bleeding, trouble breathing, redness and pain at the surgery site, drainage, fever, or pain not relieved by medication.    Additional Instructions:   Keep green arm band on for 4 days     Please contact your physician with any problems or Same Day Surgery at [phone], Monday through Friday 6 am to 4 pm, or [place] at [facility] number at [phone].       Interscalene Nerve Block with Exparel   For your surgery you have received an Interscalene Nerve Block with Exparel. Nerve Blocks affect many types of nerves, including nerves that control movement, pain and normal sensation.  You may experience feelings such as numbness, tingling, heaviness, weakness or the inability to move your arm or the feeling or sensation that your arm has "fallen asleep". A nerve block with Exparel can last up to 5 days.  Usually the weakness wears off first.  The tingling and heaviness usually wear off next.  Finally you may start to notice pain.  Keep in mind that this may occur in any order.  Once a nerve block starts to wear off it is usually completely gone within 60 minutes. ISNB may cause mild shortness of breath, a hoarse voice, blurry vision, unequal pupils, or drooping of the face on the same side as the nerve block.  These symptoms will usually resolve with the numbness.  Very rarely the procedure itself can cause mild seizures. If needed,  your surgeon will give you a prescription for pain medication.  It will take about 60 minutes for the oral pain medication to become fully effective.  So, it is recommended that you start taking this medication before the nerve block first begins to wear off, or when you first begin to feel discomfort. Take your pain medication only as prescribed.  Pain medication can cause sedation and decrease your breathing if you take more than you need for the level of pain that you have. Nausea is a common side effect of many pain medications.  You may want to eat something before taking your pain medicine to prevent nausea. After an Interscalene nerve block, you cannot feel pain, pressure or extremes in temperature in the effected arm.  Because your arm is numb it is at an increased risk for injury.  To decrease the possibility of injury, please practice the following:  While you are awake change the position of your arm frequently to prevent too much pressure on any one area for prolonged periods of time.  If you have a cast or tight dressing, check the color or your fingers every couple of hours.  Call your surgeon with the appearance of any discoloration (white or blue). If you are given a sling to wear before you go home, please wear it  at all times until the block has completely worn off.  Do not get up at night without your sling.  Please contact [facility] or your surgeon if you do not begin to regain sensation after 7 days from the surgery.  Anesthesia may be contacted by calling the Same Day Surgery Department, Mon. through Fri., 6 am to 4 pm at [phone].   If you experience any other problems or concerns, please contact your surgeon's office. If you experience severe or prolonged shortness of breath go to the nearest emergency department.

## 2022-03-12 IMAGING — CT CT ABD-PELV W/ CM
2 of 5 series · 15 of 46 positions shown, 17 images · IV contrast (APPLIED)
Comparison: CT the abdomen and pelvis 11/19/2020.

CLINICAL DATA: 64-year-old male with abdominal pain. Suspected
acute diverticulitis.

EXAM:
CT ABDOMEN AND PELVIS WITH CONTRAST
TECHNIQUE: Multidetector CT imaging of the abdomen and pelvis was performed
using the standard protocol following bolus administration of
intravenous contrast.
CONTRAST:  100mL OMNIPAQUE IOHEXOL 300 MG/ML  SOLN

[Series 3: abd/ pelvis 5.0 i30f 2 · axial · 0.76mm/px · z∈[+798,+1212]mm · 12 of 93 slices shown, 14 images]
[im 5/93  soft-tissue]
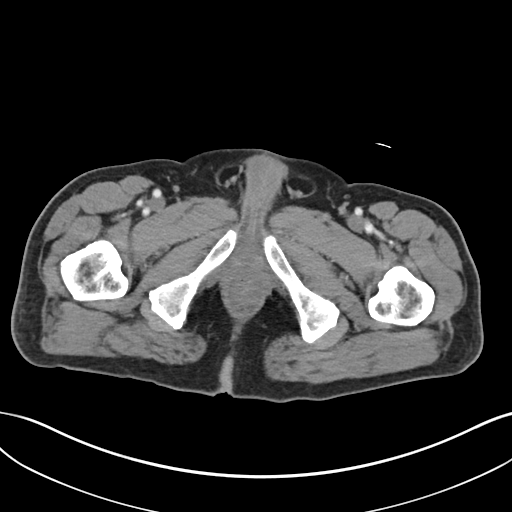
[im 5/93  bone]
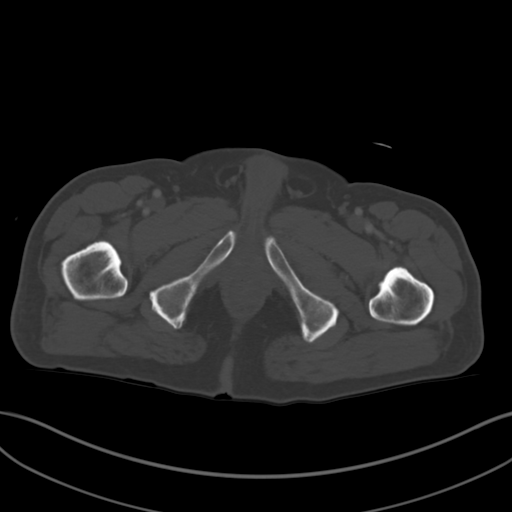
[im 15/93  soft-tissue]
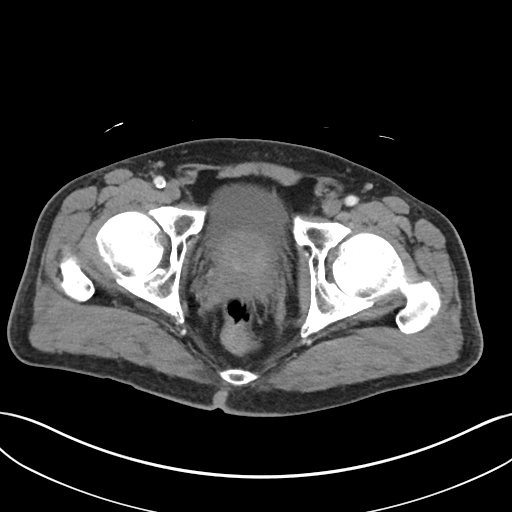
[im 20/93  soft-tissue]
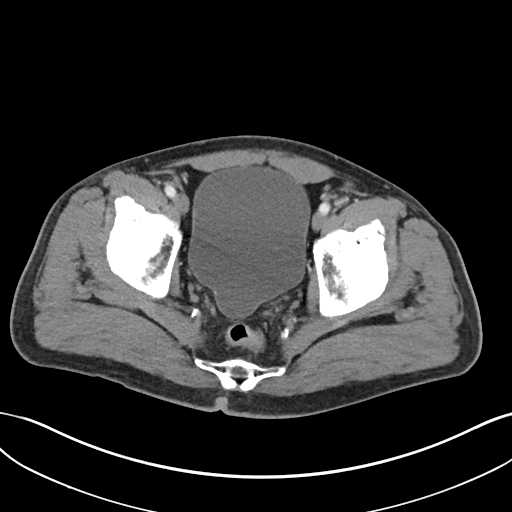
[im 30/93  soft-tissue]
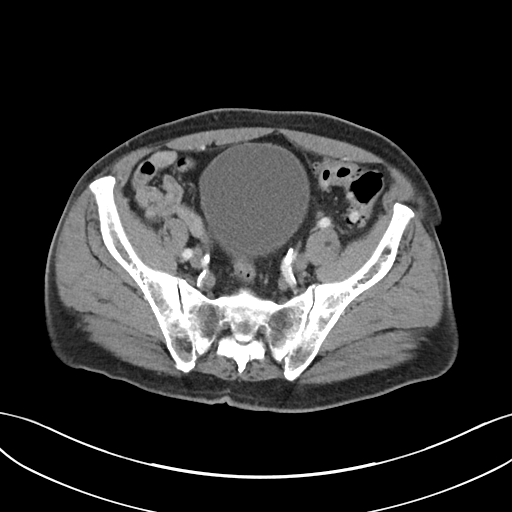
[im 34/93  soft-tissue]
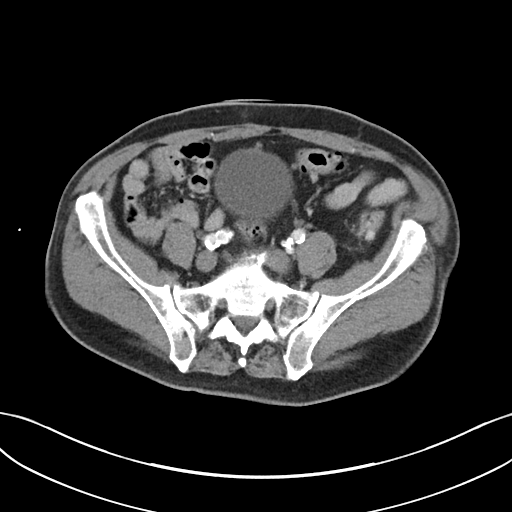
[im 44/93  soft-tissue]
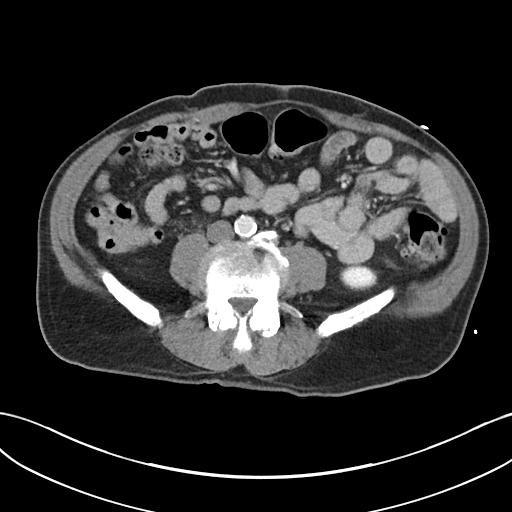
[im 49/93  soft-tissue]
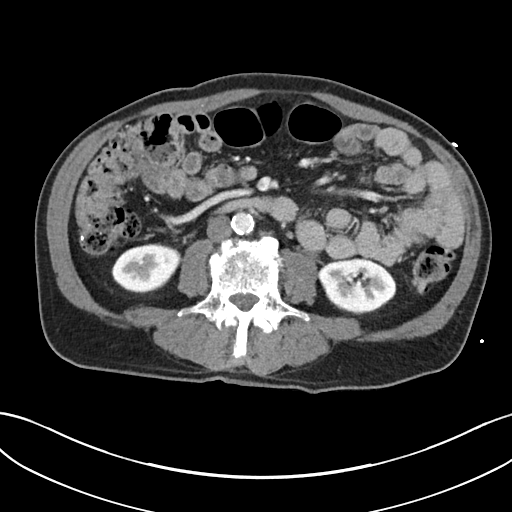
[im 59/93  soft-tissue]
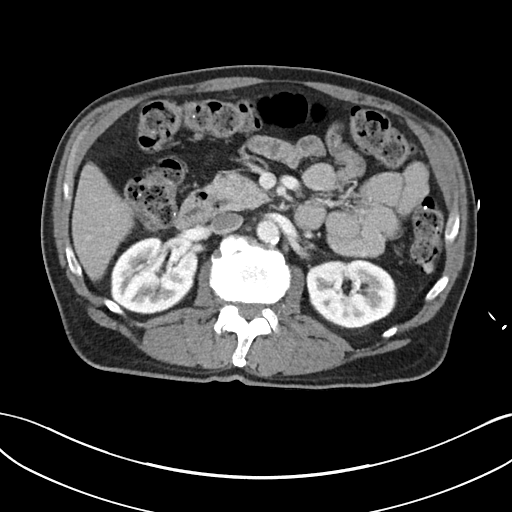
[im 63/93  soft-tissue]
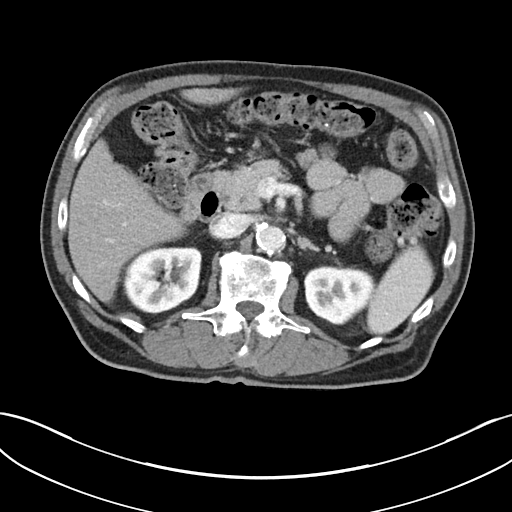
[im 63/93  bone]
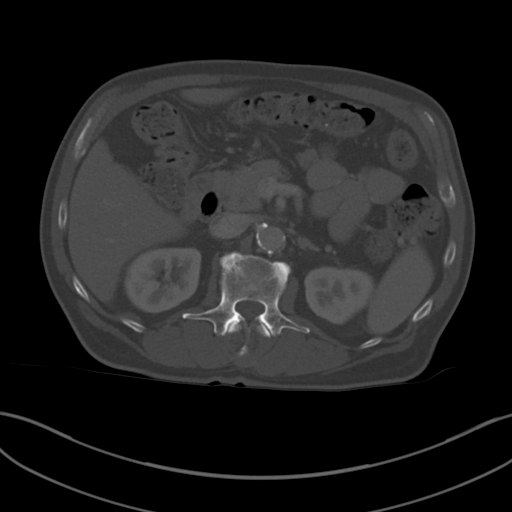
[im 73/93  soft-tissue]
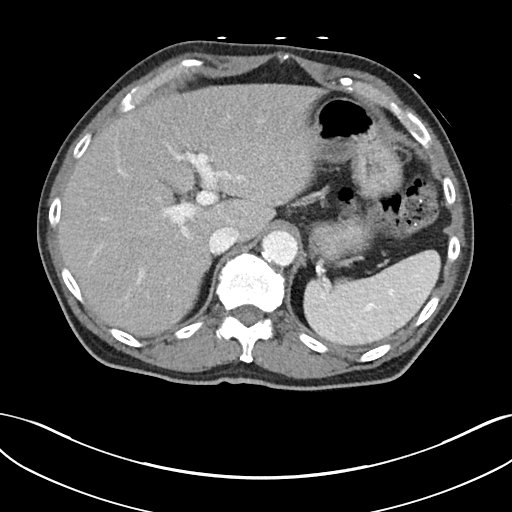
[im 78/93  soft-tissue]
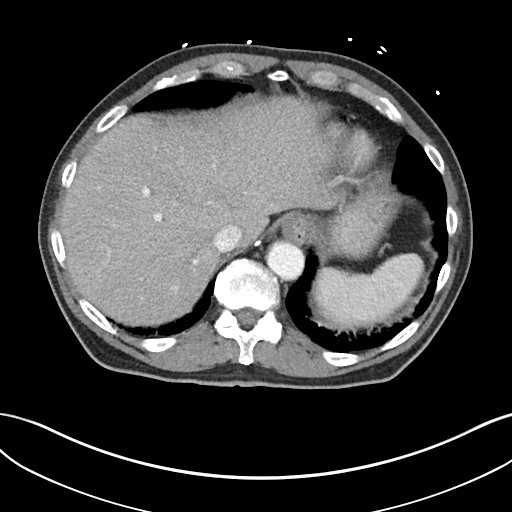
[im 88/93  soft-tissue]
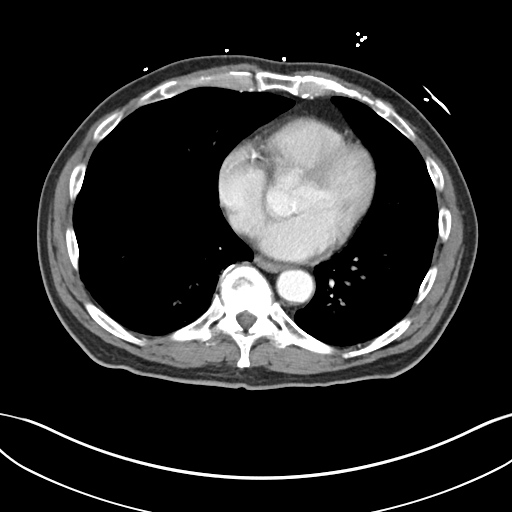

[Series 6: coronal soft tissue · coronal · 0.79mm/px · 3 of 101 slices shown]
[im 34/101  soft-tissue]
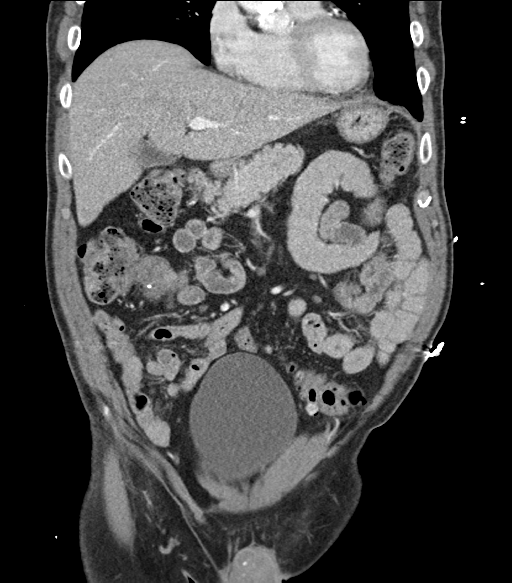
[im 45/101  soft-tissue]
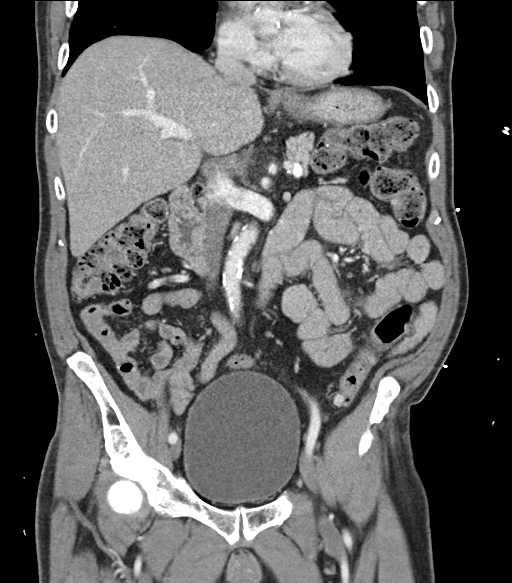
[im 56/101  soft-tissue]
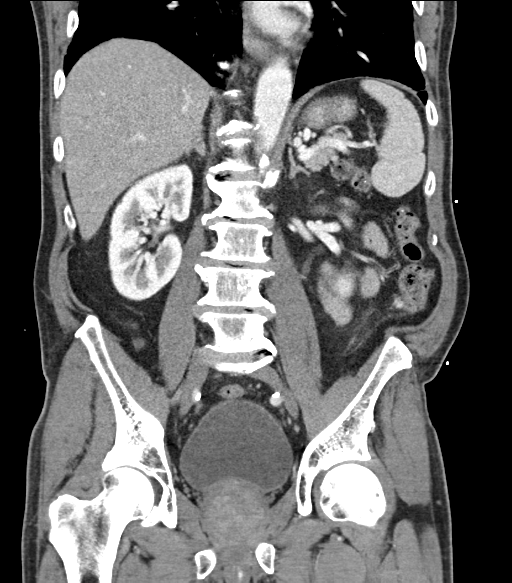

[15 of 46 positions shown; findings below may reference images not displayed]

FINDINGS: Lower chest: Atherosclerotic calcifications are noted in the
descending thoracic aorta as well as the left anterior descending,
left circumflex and right coronary arteries. Thickening
calcification of the aortic valve.

Hepatobiliary: Ill-defined intermediate attenuation (44 HU) lesion
in segment 4A of the liver, incompletely characterized, but similar
to remote prior examinations dating back to at least 7647,
presumably benign. No other suspicious appearing hepatic lesions. No
intra or extrahepatic biliary ductal dilatation. Gallbladder is
normal in appearance.

Pancreas: No pancreatic mass. No pancreatic ductal dilatation. No
pancreatic or peripancreatic fluid collections or inflammatory
changes.

Spleen: Unremarkable.

Adrenals/Urinary Tract: Bilateral kidneys and adrenal glands are
normal in appearance. No hydroureteronephrosis. Urinary bladder is
normal in appearance.

Stomach/Bowel: The appearance of the stomach is normal. No
pathologic dilatation of small bowel or colon. A few scattered
colonic diverticulae are noted. Adjacent to the proximal sigmoid
colon there are some subtle inflammatory changes, concerning for an
acute diverticulitis. No discrete diverticular abscess or signs of
frank perforation are noted at this time. The appendix is not
confidently identified and may be surgically absent. Regardless,
there are no inflammatory changes noted adjacent to the cecum to
suggest the presence of an acute appendicitis at this time.

Vascular/Lymphatic: Aortic atherosclerosis. No lymphadenopathy noted
in the abdomen or pelvis.

Reproductive: Prostate gland and seminal vesicles are unremarkable
in appearance.

Other: No significant volume of ascites.  No pneumoperitoneum.

Musculoskeletal: There are no aggressive appearing lytic or blastic
lesions noted in the visualized portions of the skeleton.
IMPRESSION: 1. Colonic diverticulosis with subtle inflammatory changes adjacent
to the proximal sigmoid colon, concerning for an acute
diverticulitis. No diverticular abscess or signs of frank
perforation are noted at this time.
2. Aortic atherosclerosis, in addition to at least 3 vessel coronary
artery disease. Please note that although the presence of coronary
artery calcium documents the presence of coronary artery disease,
the severity of this disease and any potential stenosis cannot be
assessed on this non-gated CT examination. Assessment for potential
risk factor modification, dietary therapy or pharmacologic therapy
may be warranted, if clinically indicated.
3. There are calcifications of the aortic valve. Echocardiographic
correlation for evaluation of potential valvular dysfunction may be
warranted if clinically indicated.
4. Additional incidental findings, as above.

## 2022-05-03 ENCOUNTER — Other Ambulatory Visit: Payer: Self-pay

## 2022-05-03 ENCOUNTER — Emergency Department
Admission: EM | Admit: 2022-05-03 | Discharge: 2022-05-03 | Discharge status: Against Medical Advice | Payer: Medicaid Other | Attending: Emergency Medicine | Admitting: Emergency Medicine

## 2022-05-03 DIAGNOSIS — Z5321 Procedure and treatment not carried out due to patient leaving prior to being seen by health care provider: Secondary | ICD-10-CM | POA: Diagnosis not present

## 2022-05-03 DIAGNOSIS — M62838 Other muscle spasm: Secondary | ICD-10-CM | POA: Diagnosis not present

## 2022-05-03 DIAGNOSIS — M542 Cervicalgia: Secondary | ICD-10-CM | POA: Insufficient documentation

## 2022-05-03 LAB — CBC WITH DIFFERENTIAL/PLATELET
Abs Immature Granulocytes: 0.03 10*3/uL (ref 0.00–0.07)
Basophils Absolute: 0.1 10*3/uL (ref 0.0–0.1)
Basophils Relative: 1 %
Eosinophils Absolute: 0.3 10*3/uL (ref 0.0–0.5)
Eosinophils Relative: 3 %
HCT: 42.7 % (ref 39.0–52.0)
Hemoglobin: 14.3 g/dL (ref 13.0–17.0)
Immature Granulocytes: 0 %
Lymphocytes Relative: 13 %
Lymphs Abs: 1.3 10*3/uL (ref 0.7–4.0)
MCH: 29.4 pg (ref 26.0–34.0)
MCHC: 33.5 g/dL (ref 30.0–36.0)
MCV: 87.9 fL (ref 80.0–100.0)
Monocytes Absolute: 0.8 10*3/uL (ref 0.1–1.0)
Monocytes Relative: 8 %
Neutro Abs: 7.3 10*3/uL (ref 1.7–7.7)
Neutrophils Relative %: 75 %
Platelets: 262 10*3/uL (ref 150–400)
RBC: 4.86 MIL/uL (ref 4.22–5.81)
RDW: 12.5 % (ref 11.5–15.5)
WBC: 9.7 10*3/uL (ref 4.0–10.5)
nRBC: 0 % (ref 0.0–0.2)

## 2022-05-03 LAB — BASIC METABOLIC PANEL
Anion gap: 8 (ref 5–15)
BUN: 14 mg/dL (ref 8–23)
CO2: 24 mmol/L (ref 22–32)
Calcium: 9.2 mg/dL (ref 8.9–10.3)
Chloride: 105 mmol/L (ref 98–111)
Creatinine, Ser: 0.73 mg/dL (ref 0.61–1.24)
GFR, Estimated: >60 mL/min (ref >60)
Glucose, Bld: 146 mg/dL — ABNORMAL HIGH (ref 70–99)
Potassium: 4 mmol/L (ref 3.5–5.1)
Sodium: 137 mmol/L (ref 135–145)

## 2022-05-03 NOTE — ED Triage Notes (Signed)
Pt arrives with c/o left sided neck pain after a steroid shot last week. Pt endorses muscle spasms in neck and unable to move his neck without severe pain.

## 2024-04-24 ENCOUNTER — Other Ambulatory Visit: Payer: Self-pay

## 2024-04-24 ENCOUNTER — Emergency Department (HOSPITAL_COMMUNITY)

## 2024-04-24 ENCOUNTER — Encounter (HOSPITAL_COMMUNITY): Payer: Self-pay

## 2024-04-24 ENCOUNTER — Emergency Department (HOSPITAL_COMMUNITY)
Admission: EM | Admit: 2024-04-24 | Discharge: 2024-04-24 | Disposition: A | Discharge status: Home / Self Care | Attending: Emergency Medicine | Admitting: Emergency Medicine

## 2024-04-24 DIAGNOSIS — E119 Type 2 diabetes mellitus without complications: Secondary | ICD-10-CM | POA: Diagnosis not present

## 2024-04-24 DIAGNOSIS — K5732 Diverticulitis of large intestine without perforation or abscess without bleeding: Secondary | ICD-10-CM | POA: Insufficient documentation

## 2024-04-24 DIAGNOSIS — I10 Essential (primary) hypertension: Secondary | ICD-10-CM | POA: Diagnosis not present

## 2024-04-24 DIAGNOSIS — K5733 Diverticulitis of large intestine without perforation or abscess with bleeding: Secondary | ICD-10-CM

## 2024-04-24 DIAGNOSIS — R748 Abnormal levels of other serum enzymes: Secondary | ICD-10-CM | POA: Diagnosis not present

## 2024-04-24 DIAGNOSIS — R1032 Left lower quadrant pain: Secondary | ICD-10-CM | POA: Diagnosis present

## 2024-04-24 LAB — CBC
HCT: 43.5 % (ref 39.0–52.0)
Hemoglobin: 14.5 g/dL (ref 13.0–17.0)
MCH: 30.5 pg (ref 26.0–34.0)
MCHC: 33.3 g/dL (ref 30.0–36.0)
MCV: 91.6 fL (ref 80.0–100.0)
Platelets: 212 K/uL (ref 150–400)
RBC: 4.75 MIL/uL (ref 4.22–5.81)
RDW: 12.6 % (ref 11.5–15.5)
WBC: 7.9 K/uL (ref 4.0–10.5)
nRBC: 0 % (ref 0.0–0.2)

## 2024-04-24 LAB — COMPREHENSIVE METABOLIC PANEL WITH GFR
ALT: 25 U/L (ref 0–44)
AST: 20 U/L (ref 15–41)
Albumin: 3.9 g/dL (ref 3.5–5.0)
Alkaline Phosphatase: 37 U/L — ABNORMAL LOW (ref 38–126)
Anion gap: 11 (ref 5–15)
BUN: 28 mg/dL — ABNORMAL HIGH (ref 8–23)
CO2: 25 mmol/L (ref 22–32)
Calcium: 9.6 mg/dL (ref 8.9–10.3)
Chloride: 104 mmol/L (ref 98–111)
Creatinine, Ser: 1.15 mg/dL (ref 0.61–1.24)
GFR, Estimated: >60 mL/min (ref >60)
Glucose, Bld: 113 mg/dL — ABNORMAL HIGH (ref 70–99)
Potassium: 5.1 mmol/L (ref 3.5–5.1)
Sodium: 140 mmol/L (ref 135–145)
Total Bilirubin: 0.7 mg/dL (ref 0.0–1.2)
Total Protein: 7.1 g/dL (ref 6.5–8.1)

## 2024-04-24 LAB — URINALYSIS, ROUTINE W REFLEX MICROSCOPIC
Bacteria, UA: NONE SEEN
Bilirubin Urine: NEGATIVE
Glucose, UA: NEGATIVE mg/dL
Hgb urine dipstick: NEGATIVE
Ketones, ur: NEGATIVE mg/dL
Leukocytes,Ua: NEGATIVE
Nitrite: NEGATIVE
Protein, ur: 30 mg/dL — AB
Specific Gravity, Urine: 1.027 (ref 1.005–1.030)
pH: 5 (ref 5.0–8.0)

## 2024-04-24 LAB — LIPASE, BLOOD: Lipase: 80 U/L — ABNORMAL HIGH (ref 11–51)

## 2024-04-24 MED ORDER — IOHEXOL 350 MG/ML SOLN
75.0000 mL | Freq: Once | INTRAVENOUS | Status: AC | PRN
  Administered 2024-04-24: 75 mL via INTRAVENOUS

## 2024-04-24 MED ORDER — OXYCODONE-ACETAMINOPHEN 5-325 MG PO TABS
1.0000 | ORAL_TABLET | ORAL | Status: AC | PRN
  Administered 2024-04-24 (×2): 1 via ORAL
  Filled 2024-04-24: qty 1

## 2024-04-24 MED ORDER — OXYCODONE-ACETAMINOPHEN 5-325 MG PO TABS
ORAL_TABLET | ORAL | Status: AC
  Filled 2024-04-24: qty 1

## 2024-04-24 MED ORDER — SULFAMETHOXAZOLE-TRIMETHOPRIM 800-160 MG PO TABS: 1.0000 | ORAL_TABLET | Freq: Two times a day (BID) | ORAL | 0 refills | Status: AC

## 2024-04-24 MED ORDER — OXYCODONE-ACETAMINOPHEN 5-325 MG PO TABS: 1.0000 | ORAL_TABLET | Freq: Four times a day (QID) | ORAL | 0 refills | Status: AC | PRN

## 2024-04-24 MED ORDER — METRONIDAZOLE 500 MG PO TABS: 500.0000 mg | ORAL_TABLET | Freq: Two times a day (BID) | ORAL | 0 refills | Status: AC

## 2024-04-24 MED ORDER — MORPHINE SULFATE (PF) 4 MG/ML IV SOLN
4.0000 mg | Freq: Once | INTRAVENOUS | Status: AC
  Administered 2024-04-24: 4 mg via INTRAVENOUS
  Filled 2024-04-24: qty 1

## 2024-04-24 NOTE — ED Triage Notes (Signed)
 Patient states he thinks his diverticulitis is flaring up, he has been having pain since yesterday.

## 2024-04-24 NOTE — Discharge Instructions (Signed)
 You were seen today for abdominal. While you were here we monitored your vitals, preformed a physical exam, and CT scan and labs.  It appears you have diverticulitis  Things to do:  - Follow up with your primary care provider within the next 1-2 weeks - Please take the antibiotics as prescribed and be medication as needed.  Return to the emergency department if you have any new or worsening symptoms or if you have any other concerns.

## 2024-04-24 NOTE — ED Provider Notes (Signed)
 [place] EMERGENCY DEPARTMENT AT [facility] Provider Note  MDM   HPI/ROS:  Frederick Morales is a 68 y.o. male with a medical history as below who presents with left lower quadrant abdominal pain that started yesterday.  Patient reports a history of diverticulitis and feels this pain is similar.  Is sharp, relatively constant, worsened with movement and palpation and mildly alleviated with rest.  He has taken some Tylenol  at home without significant reprieve.  Denies any diarrhea or constipation states he is having normal soft bowel movements.  Denies bloody or black or tarry stools.  Denies nausea or vomiting, fevers chills denies dysuria or blood in his urine.  Denies testicular pain or swelling  Physical exam is notable for: - Left lower quadrant and left flank tenderness with mild CVA tenderness.  Otherwise generally well-appearing, no peritonitis  On my initial evaluation, patient is:  -Vital signs stable. Patient afebrile, hemodynamically stable, and non-toxic appearing. -Additional history obtained from chart review   Differentials include Appendicitis, Bowel Obstruction, AAA, UTI, Pyelonephritis, Nephrolithiasis, Pancreatitis, Cholecystitis, Shingles, Perforated Bowel or Ulcer, Diverticulosis/itis, Ischemic Mesentery, Inflammatory Bowel Disease, Strangulated/Incarcerated Hernia,  Testicular Torsion, STD, Epididymitis, Orchitis  Given this patient's history of diverticulitis and focal left lower quadrant pain believe CT is appropriate.  He has some mild CVA tenderness however no blood in his UA which lowers my concern for nephrolithiasis.  No focal right lower quadrant pain or suprapubic pain.  His hemoglobin is appropriate and he has had no reported blood per rectum.  No epigastric pain that would suggest pancreatitis however mild lipase elevation.  His workup is as below concerning for uncomplicated diverticulitis.  Given this and improvement of his pain with pain medication I  believe appropriate for discharge with close follow-up with PCP.  Prescribed antibiotics and pain medication and discharged in stable condition with strict return precautions   Interpretations, interventions, and the patient's course of care are documented below.    Clinical Course as of 04/24/24 2004  Mon Apr 24, 2024  1738 BUN(!): 28 Trace [RC]  1738 CMP without gross electrolyte abnormalities.  Renal function appropriate [RC]  1738 CBC without leukocytosis or anemia [RC]  1738 No evidence of UTI on UA [RC]  1738 Lipase(!): 80 Mild lipase elevation [RC]  1905 CT ABDOMEN PELVIS W CONTRAST Mild acute diverticulitis no abscess or perforation.  Left lower lobe pulmonary nodule.  Discussed with patient and he will follow-up outpatient for repeat CT [RC]    Clinical Course User Index [RC] Sharyne Darina RAMAN, MD      Disposition:  I discussed the plan for discharge with the patient and/or their surrogate at bedside prior to discharge and they were in agreement with the plan and verbalized understanding of the return precautions provided. All questions answered to the best of my ability. Ultimately, the patient was discharged in stable condition with stable vital signs. I am reassured that they are capable of close follow up and good social support at home.   Clinical Impression: No diagnosis found.  Rx / DC Orders ED Discharge Orders     None       The plan for this patient was discussed with Dr. Randol, who voiced agreement and who oversaw evaluation and treatment of this patient.   Clinical Complexity A medically appropriate history, review of systems, and physical exam was performed.  My independent interpretations of EKG, labs, and radiology are documented in the ED course above.   If decision rules were used  in this patient's evaluation, they are listed below.   Click here for ABCD2, HEART and other calculatorsREFRESH Note before signing   Patient's presentation is most  consistent with acute presentation with potential threat to life or bodily function.  Medical Decision Making Amount and/or Complexity of Data Reviewed Labs: ordered. Decision-making details documented in ED Course. Radiology: ordered. Decision-making details documented in ED Course.  Risk Prescription drug management.    HPI/ROS      See MDM section for pertinent HPI and ROS. A complete ROS was performed with pertinent positives/negatives noted above.   Past Medical History:  Diagnosis Date   Acute pancreatitis 06/21/2015   Arthritis    Benign prostate hyperplasia    Blood transfusion    Diabetes mellitus    TYPE 2   Diverticulitis    Heart murmur    Hepatitis C    Hep C, Harvoni and cleared in Jun 2016   Hyperlipidemia    Hypertension    MRSA (methicillin resistant Staphylococcus aureus)    skin infections   Primary osteoarthritis involving multiple joints     Past Surgical History:  Procedure Laterality Date   APPENDECTOMY     COLONOSCOPY  07/06/2019   COLONOSCOPY  01/18/2015   DENTAL SURGERY  10/18/2014   SHOULDER ARTHROSCOPY WITH BICEPS TENDON REPAIR Right 08/28/2021   Procedure: SHOULDER ARTHROSCOPY WITH BICEPS TENODESIS;  Surgeon: Marchia Drivers, MD;  Location: [facility] ORS;  Service: Orthopedics;  Laterality: Right;   SHOULDER ARTHROSCOPY WITH DISTAL CLAVICLE RESECTION Right 08/28/2021   Procedure: SHOULDER ARTHROSCOPY WITH DISTAL CLAVICLE EXCISION;  Surgeon: Marchia Drivers, MD;  Location: [facility] OR[facility];  Service: Orthopedics;  Laterality: Right;   SHOULDER ARTHROSCOPY WITH SUBACROMIAL DECOMPRESSION Right 08/28/2021   Procedure: SHOULDER ARTHROSCOPY WITH SUBACROMIAL DECOMPRESSION;  Surgeon: Marchia Drivers, MD;  Location: [facility] OR[facility];  Service: Orthopedics;  Laterality: Right;   TONSILLECTOMY        Physical Exam   Vitals:   04/24/24 1551 04/24/24 1553  BP: (!) 116/90   Pulse: 94   Resp: 14   Temp: 97.6 F (36.4 C)   SpO2: 98%   Weight:  70.3 kg  Height:   5' 9 (1.753 m)    Physical Exam Vitals and nursing note reviewed.  Constitutional:      General: He is not in acute distress.    Appearance: He is well-developed.  HENT:     Head: Normocephalic and atraumatic.  Eyes:     Conjunctiva/sclera: Conjunctivae normal.  Cardiovascular:     Rate and Rhythm: Normal rate and regular rhythm.     Heart sounds: No murmur heard. Pulmonary:     Effort: Pulmonary effort is normal. No respiratory distress.     Breath sounds: Normal breath sounds.  Abdominal:     Palpations: Abdomen is soft.     Tenderness: There is abdominal tenderness in the left lower quadrant. There is left CVA tenderness. There is no guarding or rebound.  Musculoskeletal:        General: No swelling.     Cervical back: Neck supple.  Skin:    General: Skin is warm and dry.     Capillary Refill: Capillary refill takes less than 2 seconds.  Neurological:     Mental Status: He is alert.  Psychiatric:        Mood and Affect: Mood normal.      Procedures   If procedures were preformed on this patient, they are listed below:  Procedures   @BBSIG @  Please note that this documentation was produced with the assistance of voice-to-text technology and may contain errors.    Sharyne Darina RAMAN, MD 04/24/24 LOVELL Randol Simmonds, MD 04/26/24 [phone]

## 2024-10-10 ENCOUNTER — Emergency Department (HOSPITAL_COMMUNITY)

## 2024-10-10 ENCOUNTER — Telehealth (HOSPITAL_COMMUNITY): Payer: Self-pay | Admitting: Emergency Medicine

## 2024-10-10 ENCOUNTER — Emergency Department (HOSPITAL_COMMUNITY)
Admission: EM | Admit: 2024-10-10 | Discharge: 2024-10-10 | Disposition: A | Discharge status: Home / Self Care | Attending: Emergency Medicine | Admitting: Emergency Medicine

## 2024-10-10 ENCOUNTER — Other Ambulatory Visit: Payer: Self-pay

## 2024-10-10 DIAGNOSIS — Z7982 Long term (current) use of aspirin: Secondary | ICD-10-CM | POA: Insufficient documentation

## 2024-10-10 DIAGNOSIS — R911 Solitary pulmonary nodule: Secondary | ICD-10-CM | POA: Insufficient documentation

## 2024-10-10 DIAGNOSIS — Z79899 Other long term (current) drug therapy: Secondary | ICD-10-CM | POA: Insufficient documentation

## 2024-10-10 DIAGNOSIS — E119 Type 2 diabetes mellitus without complications: Secondary | ICD-10-CM | POA: Insufficient documentation

## 2024-10-10 DIAGNOSIS — I10 Essential (primary) hypertension: Secondary | ICD-10-CM | POA: Insufficient documentation

## 2024-10-10 DIAGNOSIS — M542 Cervicalgia: Secondary | ICD-10-CM | POA: Insufficient documentation

## 2024-10-10 DIAGNOSIS — Z7984 Long term (current) use of oral hypoglycemic drugs: Secondary | ICD-10-CM | POA: Insufficient documentation

## 2024-10-10 MED ORDER — LIDOCAINE 5 % EX PTCH: 1.0000 | MEDICATED_PATCH | CUTANEOUS | 0 refills | Status: AC

## 2024-10-10 MED ORDER — DIAZEPAM 5 MG PO TABS
5.0000 mg | ORAL_TABLET | Freq: Once | ORAL | Status: AC
  Administered 2024-10-10: 5 mg via ORAL
  Filled 2024-10-10: qty 1

## 2024-10-10 MED ORDER — HYDROCODONE-ACETAMINOPHEN 5-325 MG PO TABS: 1.0000 | ORAL_TABLET | ORAL | 0 refills | Status: AC | PRN

## 2024-10-10 NOTE — Telephone Encounter (Signed)
 Patient also requested a prescription for Lidoderm  patches which was sent to his pharmacy.

## 2024-10-10 NOTE — ED Notes (Signed)
 Pt to CT

## 2024-10-10 NOTE — Discharge Instructions (Addendum)
 Make an appointment to follow-up with the orthopedist.  Return to the emergency room if you have any worsening symptoms.  You had a small nodule in your lungs noted on the CT scan.  Your doctor needs to further evaluate this.
